# Patient Record
Sex: Female | Born: 1990 | Race: Black or African American | Hispanic: No | Marital: Single | Smoking: Current every day smoker
Health system: Southern US, Community
[De-identification: ages and names within clinical notes are randomized; demographics above are authoritative.]

## PROBLEM LIST (undated history)

## (undated) ENCOUNTER — Inpatient Hospital Stay: Payer: Self-pay

## (undated) DIAGNOSIS — O3410 Maternal care for benign tumor of corpus uteri, unspecified trimester: Secondary | ICD-10-CM

## (undated) DIAGNOSIS — D259 Leiomyoma of uterus, unspecified: Secondary | ICD-10-CM

## (undated) DIAGNOSIS — E0590 Thyrotoxicosis, unspecified without thyrotoxic crisis or storm: Secondary | ICD-10-CM

## (undated) HISTORY — PX: BREAST CYST EXCISION: SHX579

---
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
## 2008-10-08 ENCOUNTER — Emergency Department (HOSPITAL_COMMUNITY): Admission: EM | Admit: 2008-10-08 | Discharge: 2008-10-08 | Payer: Self-pay | Admitting: Emergency Medicine

## 2012-09-18 ENCOUNTER — Emergency Department: Payer: Self-pay | Admitting: Emergency Medicine

## 2012-09-18 LAB — URINALYSIS, COMPLETE
Bilirubin,UR: NEGATIVE
Glucose,UR: NEGATIVE mg/dL (ref 0–75)
Ketone: NEGATIVE
Leukocyte Esterase: NEGATIVE
Ph: 6 (ref 4.5–8.0)
RBC,UR: NONE SEEN /HPF (ref 0–5)
Squamous Epithelial: 1

## 2013-10-07 ENCOUNTER — Emergency Department: Payer: Self-pay | Admitting: Internal Medicine

## 2013-12-19 ENCOUNTER — Emergency Department: Payer: Self-pay | Admitting: Internal Medicine

## 2013-12-19 LAB — RAPID INFLUENZA A&B ANTIGENS (ARMC ONLY)

## 2015-03-23 ENCOUNTER — Emergency Department
Admit: 2015-03-23 | Disposition: A | Discharge status: Home / Self Care | Payer: Self-pay | Admitting: Emergency Medicine

## 2015-03-23 LAB — HCG, QUANTITATIVE, PREGNANCY: Beta Hcg, Quant.: 32491 m[IU]/mL — ABNORMAL HIGH

## 2015-03-23 LAB — CBC
HCT: 42.5 % (ref 35.0–47.0)
HGB: 13.7 g/dL (ref 12.0–16.0)
MCH: 28.2 pg (ref 26.0–34.0)
MCHC: 32.3 g/dL (ref 32.0–36.0)
MCV: 87 fL (ref 80–100)
PLATELETS: 222 10*3/uL (ref 150–440)
RBC: 4.87 10*6/uL (ref 3.80–5.20)
RDW: 14.9 % — AB (ref 11.5–14.5)
WBC: 7.2 10*3/uL (ref 3.6–11.0)

## 2015-03-31 ENCOUNTER — Emergency Department
Admit: 2015-03-31 | Disposition: A | Discharge status: Home / Self Care | Payer: Self-pay | Admitting: Emergency Medicine

## 2015-03-31 LAB — URINALYSIS, COMPLETE
Bacteria: NONE SEEN
Bilirubin,UR: NEGATIVE
Glucose,UR: NEGATIVE mg/dL (ref 0–75)
Ketone: NEGATIVE
Leukocyte Esterase: NEGATIVE
NITRITE: NEGATIVE
PROTEIN: NEGATIVE
Ph: 7 (ref 4.5–8.0)
Specific Gravity: 1.019 (ref 1.003–1.030)

## 2015-03-31 LAB — CBC
HCT: 38.2 % (ref 35.0–47.0)
HGB: 12.7 g/dL (ref 12.0–16.0)
MCH: 28.7 pg (ref 26.0–34.0)
MCHC: 33.3 g/dL (ref 32.0–36.0)
MCV: 86 fL (ref 80–100)
Platelet: 203 10*3/uL (ref 150–440)
RBC: 4.44 10*6/uL (ref 3.80–5.20)
RDW: 14.5 % (ref 11.5–14.5)
WBC: 8.7 10*3/uL (ref 3.6–11.0)

## 2015-03-31 LAB — HCG, QUANTITATIVE, PREGNANCY: BETA HCG, QUANT.: 65769 m[IU]/mL — AB

## 2015-04-28 ENCOUNTER — Encounter: Payer: Self-pay | Admitting: *Deleted

## 2015-04-28 ENCOUNTER — Emergency Department
Admission: EM | Admit: 2015-04-28 | Discharge: 2015-04-28 | Disposition: A | Discharge status: Home / Self Care | Payer: Medicaid Other | Attending: Emergency Medicine | Admitting: Emergency Medicine

## 2015-04-28 ENCOUNTER — Emergency Department: Payer: Medicaid Other

## 2015-04-28 DIAGNOSIS — O039 Complete or unspecified spontaneous abortion without complication: Secondary | ICD-10-CM | POA: Diagnosis not present

## 2015-04-28 DIAGNOSIS — N939 Abnormal uterine and vaginal bleeding, unspecified: Secondary | ICD-10-CM

## 2015-04-28 DIAGNOSIS — O209 Hemorrhage in early pregnancy, unspecified: Secondary | ICD-10-CM | POA: Diagnosis present

## 2015-04-28 DIAGNOSIS — Z3A11 11 weeks gestation of pregnancy: Secondary | ICD-10-CM | POA: Diagnosis not present

## 2015-04-28 DIAGNOSIS — Z87891 Personal history of nicotine dependence: Secondary | ICD-10-CM | POA: Diagnosis not present

## 2015-04-28 LAB — CBC
HCT: 40.6 % (ref 35.0–47.0)
Hemoglobin: 13.5 g/dL (ref 12.0–16.0)
MCH: 28.6 pg (ref 26.0–34.0)
MCHC: 33.2 g/dL (ref 32.0–36.0)
MCV: 86.3 fL (ref 80.0–100.0)
PLATELETS: 197 10*3/uL (ref 150–440)
RBC: 4.71 MIL/uL (ref 3.80–5.20)
RDW: 14.3 % (ref 11.5–14.5)
WBC: 6.1 10*3/uL (ref 3.6–11.0)

## 2015-04-28 LAB — WET PREP, GENITAL
Trich, Wet Prep: NONE SEEN
Yeast Wet Prep HPF POC: NONE SEEN

## 2015-04-28 LAB — ABO/RH
ABO/RH(D): O POS
ABO/RH(D): O POS

## 2015-04-28 LAB — CHLAMYDIA/NGC RT PCR (ARMC ONLY)
Chlamydia Tr: NOT DETECTED
N gonorrhoeae: NOT DETECTED

## 2015-04-28 LAB — HCG, QUANTITATIVE, PREGNANCY: HCG, BETA CHAIN, QUANT, S: 7792 m[IU]/mL — AB (ref <5)

## 2015-04-28 NOTE — ED Notes (Signed)
Pt is G1P0A0, LMP 01/27/15. Pt started having pelvic pain yesterday morning. Pt states when she came home from work, worsening pain and bleeding about 2200. Pt states woke from sleep at 0200, pain and bleeding worse at that time. Pt is not wearing a pad and cannot report saturation. Pt has had no prenatal care as yet.

## 2015-04-28 NOTE — Discharge Instructions (Signed)
Miscarriage A miscarriage is the sudden loss of an unborn baby (fetus) before the 20th week of pregnancy. Most miscarriages happen in the first 3 months of pregnancy. Sometimes, it happens before a woman even knows she is pregnant. A miscarriage is also called a "spontaneous miscarriage" or "early pregnancy loss." Having a miscarriage can be an emotional experience. Talk with your caregiver about any questions you may have about miscarrying, the grieving process, and your future pregnancy plans. CAUSES   Problems with the fetal chromosomes that make it impossible for the baby to develop normally. Problems with the baby's genes or chromosomes are most often the result of errors that occur, by chance, as the embryo divides and grows. The problems are not inherited from the parents.  Infection of the cervix or uterus.   Hormone problems.   Problems with the cervix, such as having an incompetent cervix. This is when the tissue in the cervix is not strong enough to hold the pregnancy.   Problems with the uterus, such as an abnormally shaped uterus, uterine fibroids, or congenital abnormalities.   Certain medical conditions.   Smoking, drinking alcohol, or taking illegal drugs.   Trauma.  Often, the cause of a miscarriage is unknown.  SYMPTOMS   Vaginal bleeding or spotting, with or without cramps or pain.  Pain or cramping in the abdomen or lower back.  Passing fluid, tissue, or blood clots from the vagina. DIAGNOSIS  Your caregiver will perform a physical exam. You may also have an ultrasound to confirm the miscarriage. Blood or urine tests may also be ordered. TREATMENT   Sometimes, treatment is not necessary if you naturally pass all the fetal tissue that was in the uterus. If some of the fetus or placenta remains in the body (incomplete miscarriage), tissue left behind may become infected and must be removed. Usually, a dilation and curettage (D and C) procedure is performed.  During a D and C procedure, the cervix is widened (dilated) and any remaining fetal or placental tissue is gently removed from the uterus.  Antibiotic medicines are prescribed if there is an infection. Other medicines may be given to reduce the size of the uterus (contract) if there is a lot of bleeding.  If you have Rh negative blood and your baby was Rh positive, you will need a Rh immunoglobulin shot. This shot will protect any future baby from having Rh blood problems in future pregnancies. HOME CARE INSTRUCTIONS   Your caregiver may order bed rest or may allow you to continue light activity. Resume activity as directed by your caregiver.  Have someone help with home and family responsibilities during this time.   Keep track of the number of sanitary pads you use each day and how soaked (saturated) they are. Write down this information.   Do not use tampons. Do not douche or have sexual intercourse until approved by your caregiver.   Only take over-the-counter or prescription medicines for pain or discomfort as directed by your caregiver.   Do not take aspirin. Aspirin can cause bleeding.   Keep all follow-up appointments with your caregiver.   If you or your partner have problems with grieving, talk to your caregiver or seek counseling to help cope with the pregnancy loss. Allow enough time to grieve before trying to get pregnant again.  SEEK IMMEDIATE MEDICAL CARE IF:   You have severe cramps or pain in your back or abdomen.  You have a fever.  You pass large blood clots (walnut-sized  or larger) ortissue from your vagina. Save any tissue for your caregiver to inspect.   Your bleeding increases.   You have a thick, bad-smelling vaginal discharge.  You become lightheaded, weak, or you faint.   You have chills.  MAKE SURE YOU:  Understand these instructions.  Will watch your condition.  Will get help right away if you are not doing well or get  worse. Document Released: 05/25/2001 Document Revised: 03/26/2013 Document Reviewed: 01/18/2012 [facility] Patient Information 2015 [place], [place]. This information is not intended to replace advice given to you by your health care provider. Make sure you discuss any questions you have with your health care provider.  Incomplete Miscarriage A miscarriage is the sudden loss of an unborn baby (fetus) before the 20th week of pregnancy. In an incomplete miscarriage, parts of the fetus or placenta (afterbirth) remain in the body.  Having a miscarriage can be an emotional experience. Talk with your health care provider about any questions you may have about miscarrying, the grieving process, and your future pregnancy plans. CAUSES   Problems with the fetal chromosomes that make it impossible for the baby to develop normally. Problems with the baby's genes or chromosomes are most often the result of errors that occur by chance as the embryo divides and grows. The problems are not inherited from the parents.  Infection of the cervix or uterus.  Hormone problems.  Problems with the cervix, such as having an incompetent cervix. This is when the tissue in the cervix is not strong enough to hold the pregnancy.  Problems with the uterus, such as an abnormally shaped uterus, uterine fibroids, or congenital abnormalities.  Certain medical conditions.  Smoking, drinking alcohol, or taking illegal drugs.  Trauma. SYMPTOMS   Vaginal bleeding or spotting, with or without cramps or pain.  Pain or cramping in the abdomen or lower back.  Passing fluid, tissue, or blood clots from the vagina. DIAGNOSIS  Your health care provider will perform a physical exam. You may also have an ultrasound to confirm the miscarriage. Blood or urine tests may also be ordered. TREATMENT   Usually, a dilation and curettage (D&C) procedure is performed. During a D&C procedure, the cervix is widened (dilated) and any remaining  fetal or placental tissue is gently removed from the uterus.  Antibiotic medicines are prescribed if there is an infection. Other medicines may be given to reduce the size of the uterus (contract) if there is a lot of bleeding.  If you have Rh negative blood and your baby was Rh positive, you will need a Rho (D) immune globulin shot. This shot will protect any future baby from having Rh blood problems in future pregnancies.  You may be confined to bed rest. This means you should stay in bed and only get up to use the bathroom. HOME CARE INSTRUCTIONS   Rest as directed by your health care provider.  Restrict activity as directed by your health care provider. You may be allowed to continue light activity if curettage was not done but you require further treatment.  Keep track of the number of pads you use each day. Keep track of how soaked (saturated) they are. Record this information.  Do not  use tampons.  Do not douche or have sexual intercourse until approved by your health care provider.  Keep all follow-up appointments for reevaluation and continuing management.  Only take over-the-counter or prescription medicines for pain, fever, or discomfort as directed by your health care provider.  Take  antibiotic medicine as directed by your health care provider. Make sure you finish it even if you start to feel better. SEEK IMMEDIATE MEDICAL CARE IF:   You experience severe cramps in your stomach, back, or abdomen.  You have an unexplained temperature (make sure to record these temperatures).  You pass large clots or tissue (save these for your health care provider to inspect).  Your bleeding increases.  You become light-headed, weak, or have fainting episodes. MAKE SURE YOU:   Understand these instructions.  Will watch your condition.  Will get help right away if you are not doing well or get worse. Document Released: 11/29/2005 Document Revised: 04/15/2014 Document Reviewed:  06/28/2013 [facility] Patient Information 2015 [place], [place]. This information is not intended to replace advice given to you by your health care provider. Make sure you discuss any questions you have with your health care provider.

## 2015-04-28 NOTE — ED Provider Notes (Signed)
[facility] Emergency Department Provider Note  ____________________________________________  Time seen: Approximately 073 AM  I have reviewed the triage vital signs and the nursing notes.   HISTORY  Chief Complaint Vaginal Bleeding    HPI Samantha Rose is a 24 y.o. female who reports that she is [redacted] weeks pregnant. The patient comes in with vaginal bleeding that she reports started yesterday during the day. The patient reports that the bleeding has been heavy like a period. The patient reports though that she is not soaking through pads. She has had some lower abdominal pain and cramping. Currently she reports that her pain is a 0 out of 10 intensity. The patient is a G1 P0. The patient reports that she has had this bleeding in the past and has been evaluated in the emergency department. The patient has not yet seen an OB/GYN but does have an appointment to see one this week. The patient denies any vaginal discharge, denies any burning with urination, denies any fevers, chest pain, respiratory distress.   History reviewed. No pertinent past medical history.  There are no active problems to display for this patient.   Past Surgical History  Procedure Laterality Date  . Breast cyst excision Right     No current outpatient prescriptions on file.  Allergies Review of patient's allergies indicates no known allergies.  History reviewed. No pertinent family history.  Social History History  Substance Use Topics  . Smoking status: Former Smoker    Types: Cigarettes  . Smokeless tobacco: Never Used  . Alcohol Use: No    Review of Systems Constitutional: No fever/chills Eyes: No visual changes. ENT: No sore throat. Cardiovascular: Denies chest pain. Respiratory: Denies shortness of breath. Gastrointestinal: abdominal pain.   Genitourinary: Negative for dysuria. Musculoskeletal: Negative for back pain. Skin: Negative for rash. Neurological:  Negative for headaches, focal weakness or numbness.  10-point ROS otherwise negative.  ____________________________________________   PHYSICAL EXAM:  VITAL SIGNS: ED Triage Vitals  Enc Vitals Group     BP 04/28/15 0313 130/72 mmHg     Pulse Rate 04/28/15 0313 88     Resp 04/28/15 0313 16     Temp 04/28/15 0313 97.7 F (36.5 C)     Temp Source 04/28/15 0313 Oral     SpO2 04/28/15 0313 100 %     Weight 04/28/15 0313 151 lb 11.2 oz (68.811 kg)     Height 04/28/15 0313 5\' 3"  (1.6 m)     Head Cir --      Peak Flow --      Pain Score 04/28/15 0316 10     Pain Loc --      Pain Edu? --      Excl. in [place]? --     Constitutional: Alert and oriented. Well appearing and in no acute distress. Eyes: Conjunctivae are normal. PERRL. EOMI. Head: Atraumatic. Nose: No congestion/rhinnorhea. Mouth/Throat: Mucous membranes are moist.  Oropharynx non-erythematous. Cardiovascular: Normal rate, regular rhythm. Grossly normal heart sounds.  Good peripheral circulation. Respiratory: Normal respiratory effort.  No retractions. Lungs CTAB. Gastrointestinal: Soft and nontender. No distention.  Genitourinary: Normal external genitalia. Mild blood in vaginal vault with no clots. Cervix closed visually and on bimanual exam. Enlarged uterus with no tenderness to palpation. Musculoskeletal: No lower extremity tenderness nor edema.  No joint effusions. Neurologic:  Normal speech and language. No gross focal neurologic deficits are appreciated. Speech is normal.  Skin:  Skin is warm, dry and intact. No rash noted.  Psychiatric: Mood and affect are normal. Speech and behavior are normal.  ____________________________________________   LABS (all labs ordered are listed, but only abnormal results are displayed)  Labs Reviewed  HCG, QUANTITATIVE, PREGNANCY - Abnormal; Notable for the following:    hCG, Beta Chain, Quant, S 7792 (*)    All other components within normal limits  WET PREP, GENITAL   CHLAMYDIA/NGC RT PCR ([facility])   CBC  ABO/RH  ABO/RH   ____________________________________________  EKG  None ____________________________________________  RADIOLOGY  Transvaginal ultrasound: Irregular intrauterine gestational sac, no fetal pole or U exact. Findings again meet definitive criteria for failed pregnancy particularly in conjunction with decreasing beta hCG. ____________________________________________   PROCEDURES  Procedure(s) performed: None  Critical Care performed: No  ____________________________________________   INITIAL IMPRESSION / ASSESSMENT AND PLAN / ED COURSE  Pertinent labs & imaging results that were available during my care of the patient were reviewed by me and considered in my medical decision making (see chart for details).  The patient is a 24 year old female who comes in with vaginal bleeding and she is [redacted] weeks pregnant. According to the ultrasound the patient does not appear to have a fetal [place] sac in her gestational sac with a concern that this is a failed pregnancy. The patient had an ultrasound done last month with similar results. I will discharge the patient home to follow-up with OB/GYN to determine if she needs a D&C or any other intervention for the failed pregnancy. ____________________________________________   FINAL CLINICAL IMPRESSION(S) / ED DIAGNOSES  Final diagnoses:  Vaginal bleeding  Failed pregnancy  Miscarriage       Loney Hering, MD 04/28/15 [phone]

## 2016-03-12 ENCOUNTER — Emergency Department
Admission: EM | Admit: 2016-03-12 | Discharge: 2016-03-12 | Disposition: A | Discharge status: Home / Self Care | Payer: Managed Care, Other (non HMO) | Attending: Emergency Medicine | Admitting: Emergency Medicine

## 2016-03-12 ENCOUNTER — Encounter: Payer: Self-pay | Admitting: Emergency Medicine

## 2016-03-12 DIAGNOSIS — R51 Headache: Secondary | ICD-10-CM | POA: Diagnosis present

## 2016-03-12 DIAGNOSIS — F17210 Nicotine dependence, cigarettes, uncomplicated: Secondary | ICD-10-CM | POA: Insufficient documentation

## 2016-03-12 DIAGNOSIS — R519 Headache, unspecified: Secondary | ICD-10-CM

## 2016-03-12 MED ORDER — IBUPROFEN 800 MG PO TABS: 800.0000 mg | ORAL_TABLET | Freq: Three times a day (TID) | ORAL | Status: DC | PRN

## 2016-03-12 MED ORDER — IBUPROFEN 800 MG PO TABS
800.0000 mg | ORAL_TABLET | Freq: Once | ORAL | Status: AC
  Administered 2016-03-12: 800 mg via ORAL
  Filled 2016-03-12: qty 1

## 2016-03-12 MED ORDER — DIPHENHYDRAMINE HCL 25 MG PO CAPS
25.0000 mg | ORAL_CAPSULE | Freq: Once | ORAL | Status: AC
  Administered 2016-03-12: 25 mg via ORAL
  Filled 2016-03-12: qty 1

## 2016-03-12 MED ORDER — CYCLOBENZAPRINE HCL 5 MG PO TABS: 5.0000 mg | ORAL_TABLET | Freq: Three times a day (TID) | ORAL | Status: DC | PRN

## 2016-03-12 NOTE — ED Notes (Signed)
Patient reports HA since Sunday near left temporal/above left eye.  Denies any other sxs- no N/V, photophobia.  Took 2 pills of aleve last night and 2 tylenol this morning with no relief.

## 2016-03-12 NOTE — ED Provider Notes (Signed)
[facility] Emergency Department Provider Note ____________________________________________  Time seen: 41  I have reviewed the triage vital signs and the nursing notes.  HISTORY  Chief Complaint  Headache  HPI Samantha Rose is a 25 y.o. female visits to the ED for evaluation of a headache that has been intermittent since Sunday. Patient describes pain at 6/10 pain localized to the left side of the forehead. She has taken Tylenol today at 7 AM. Yesterday she dosed 2 Aleve tabs at one time. She denies any nausea, vomiting, dizziness, or vision change. She also denies any URI symptoms. She presents to the ED because typically her headaches will respond well to over-the-counter pain medicine.  History reviewed. No pertinent past medical history.  There are no active problems to display for this patient.   Past Surgical History  Procedure Laterality Date  . Breast cyst excision Right     Current Outpatient Rx  Name  Route  Sig  Dispense  Refill  . cyclobenzaprine (FLEXERIL) 5 MG tablet   Oral   Take 1 tablet (5 mg total) by mouth every 8 (eight) hours as needed for muscle spasms.   30 tablet   1   . ibuprofen (ADVIL,MOTRIN) 800 MG tablet   Oral   Take 1 tablet (800 mg total) by mouth every 8 (eight) hours as needed.   30 tablet   0     Allergies Review of patient's allergies indicates no known allergies.  History reviewed. No pertinent family history.  Social History Social History  Substance Use Topics  . Smoking status: Current Every Day Smoker    Types: Cigarettes  . Smokeless tobacco: Never Used  . Alcohol Use: No   Review of Systems  Constitutional: Negative for fever. Eyes: Negative for visual changes. ENT: Negative for sore throat. Cardiovascular: Negative for chest pain. Respiratory: Negative for shortness of breath. Gastrointestinal: Negative for abdominal pain, vomiting and diarrhea. Genitourinary: Negative for  dysuria. Musculoskeletal: Negative for back pain. Skin: Negative for rash. Neurological: Negative for focal weakness or numbness. Reports headache as above ____________________________________________  PHYSICAL EXAM:  VITAL SIGNS: ED Triage Vitals  Enc Vitals Group     BP 03/12/16 1307 116/71 mmHg     Pulse Rate 03/12/16 1307 92     Resp --      Temp 03/12/16 1307 98.4 F (36.9 C)     Temp Source 03/12/16 1307 Oral     SpO2 03/12/16 1307 97 %     Weight 03/12/16 1307 160 lb (72.576 kg)     Height 03/12/16 1307 5\' 3"  (1.6 m)     Head Cir --      Peak Flow --      Pain Score 03/12/16 1307 6     Pain Loc --      Pain Edu? --      Excl. in [place]? --     Constitutional: Alert and oriented. Well appearing and in no distress. She is found to be lively and walking in the room and interacting with her family members. Head: Normocephalic and atraumatic.      Eyes: Conjunctivae are normal. PERRL. Normal extraocular movements      Ears: Canals clear. TMs intact bilaterally.   Nose: No congestion/rhinorrhea.   Mouth/Throat: Mucous membranes are moist.   Neck: Supple. No thyromegaly. Hematological/Lymphatic/Immunological: No cervical lymphadenopathy. Cardiovascular: Normal rate, regular rhythm.  Respiratory: Normal respiratory effort. No wheezes/rales/rhonchi. Gastrointestinal: Soft and nontender. No distention. Musculoskeletal: Nontender with normal range  of motion in all extremities.  Neurologic: Cranial nerves II through XII grossly intact. Normal UE and LE DTRs bilaterally. Normal gait without ataxia. Normal speech and language. No gross focal neurologic deficits are appreciated. Skin:  Skin is warm, dry and intact. No rash noted. Psychiatric: Mood and affect are normal. Patient exhibits appropriate insight and judgment. ____________________________________________  PROCEDURES  IBU 800 mg PO Benadryl 25 mg PO ____________________________________________  INITIAL  IMPRESSION / ASSESSMENT AND PLAN / ED COURSE  Patient with near resolution of her left frontal headache following ED administration of PO medications. She otherwise is found to have a stable exam without neuromuscular deficit. She is discharged with prescriptions for ibuprofen 800 and cyclobenzaprine to dose as needed for headache pain. She will dose over-the-counter Benadryl as needed for headache pain resolution. She will follow-up with her provider for ongoing symptom management.  ____________________________________________  FINAL CLINICAL IMPRESSION(S) / ED DIAGNOSES  Final diagnoses:  Headache above the eye region      [employer], PA-C 03/12/16 1750  Lisa Roca, MD 03/12/16 1845

## 2016-03-12 NOTE — Discharge Instructions (Signed)
General Headache Without Cause A headache is pain or discomfort felt around the head or neck area. There are many causes and types of headaches. In some cases, the cause may not be found.  HOME CARE  Managing Pain  Take over-the-counter and prescription medicines only as told by your doctor.  Lie down in a dark, quiet room when you have a headache.  If directed, apply ice to the head and neck area:  Put ice in a plastic bag.  Place a towel between your skin and the bag.  Leave the ice on for 20 minutes, 2-3 times per day.  Use a heating pad or hot shower to apply heat to the head and neck area as told by your doctor.  Keep lights dim if bright lights bother you or make your headaches worse. Eating and Drinking  Eat meals on a regular schedule.  Lessen how much alcohol you drink.  Lessen how much caffeine you drink, or stop drinking caffeine. General Instructions  Keep all follow-up visits as told by your doctor. This is important.  Keep a journal to find out if certain things bring on headaches. For example, write down:  What you eat and drink.  How much sleep you get.  Any change to your diet or medicines.  Relax by getting a massage or doing other relaxing activities.  Lessen stress.  Sit up straight. Do not tighten (tense) your muscles.  Do not use tobacco products. This includes cigarettes, chewing tobacco, or e-cigarettes. If you need help quitting, ask your doctor.  Exercise regularly as told by your doctor.  Get enough sleep. This often means 7-9 hours of sleep. GET HELP IF:  Your symptoms are not helped by medicine.  You have a headache that feels different than the other headaches.  You feel sick to your stomach (nauseous) or you throw up (vomit).  You have a fever. GET HELP RIGHT AWAY IF:   Your headache becomes really bad.  You keep throwing up.  You have a stiff neck.  You have trouble seeing.  You have trouble speaking.  You have  pain in the eye or ear.  Your muscles are weak or you lose muscle control.  You lose your balance or have trouble walking.  You feel like you will pass out (faint) or you pass out.  You have confusion.   This information is not intended to replace advice given to you by your health care provider. Make sure you discuss any questions you have with your health care provider.   Document Released: 09/07/2008 Document Revised: 08/20/2015 Document Reviewed: 03/24/2015 Elsevier Interactive Patient Education [employer].  Take only the prescription meds as directed for headache pain relief. You may dose OTC Tylenol for additional non-drowsy pain relief, up to 3 times a day. Follow-up with [facility] for routine medical care.

## 2016-03-12 NOTE — ED Notes (Signed)
Pt with Headache since Sunday, now 6/10 over left eye. Denies weakness or vision changes. Has taken tylenol and aleve with no change.

## 2016-04-14 ENCOUNTER — Other Ambulatory Visit: Payer: Self-pay | Admitting: Internal Medicine

## 2016-04-14 DIAGNOSIS — E042 Nontoxic multinodular goiter: Secondary | ICD-10-CM

## 2016-04-14 DIAGNOSIS — R7989 Other specified abnormal findings of blood chemistry: Secondary | ICD-10-CM

## 2016-04-22 ENCOUNTER — Encounter
Admission: RE | Admit: 2016-04-22 | Discharge: 2016-04-22 | Disposition: A | Discharge status: Home / Self Care | Payer: Managed Care, Other (non HMO) | Source: Ambulatory Visit | Attending: Internal Medicine | Admitting: Internal Medicine

## 2016-04-22 DIAGNOSIS — E042 Nontoxic multinodular goiter: Secondary | ICD-10-CM | POA: Diagnosis present

## 2016-04-22 DIAGNOSIS — R7989 Other specified abnormal findings of blood chemistry: Secondary | ICD-10-CM

## 2016-04-22 DIAGNOSIS — R946 Abnormal results of thyroid function studies: Secondary | ICD-10-CM | POA: Insufficient documentation

## 2016-04-22 MED ORDER — SODIUM IODIDE I-123 3.7 MBQ PO CAPS
200.0000 | ORAL_CAPSULE | Freq: Once | ORAL | Status: AC
  Administered 2016-04-22: 164.8 via ORAL

## 2016-04-23 ENCOUNTER — Encounter
Admission: RE | Admit: 2016-04-23 | Discharge: 2016-04-23 | Disposition: A | Discharge status: Home / Self Care | Payer: Managed Care, Other (non HMO) | Source: Ambulatory Visit | Attending: Internal Medicine | Admitting: Internal Medicine

## 2016-05-31 ENCOUNTER — Emergency Department: Payer: Managed Care, Other (non HMO)

## 2016-05-31 ENCOUNTER — Emergency Department
Admission: EM | Admit: 2016-05-31 | Discharge: 2016-05-31 | Disposition: A | Discharge status: Home / Self Care | Payer: Managed Care, Other (non HMO) | Attending: Emergency Medicine | Admitting: Emergency Medicine

## 2016-05-31 ENCOUNTER — Encounter: Payer: Self-pay | Admitting: Emergency Medicine

## 2016-05-31 DIAGNOSIS — F17210 Nicotine dependence, cigarettes, uncomplicated: Secondary | ICD-10-CM | POA: Diagnosis not present

## 2016-05-31 DIAGNOSIS — Z79899 Other long term (current) drug therapy: Secondary | ICD-10-CM | POA: Insufficient documentation

## 2016-05-31 DIAGNOSIS — Z9889 Other specified postprocedural states: Secondary | ICD-10-CM | POA: Diagnosis not present

## 2016-05-31 DIAGNOSIS — N760 Acute vaginitis: Secondary | ICD-10-CM | POA: Insufficient documentation

## 2016-05-31 DIAGNOSIS — R935 Abnormal findings on diagnostic imaging of other abdominal regions, including retroperitoneum: Secondary | ICD-10-CM | POA: Insufficient documentation

## 2016-05-31 DIAGNOSIS — N83209 Unspecified ovarian cyst, unspecified side: Secondary | ICD-10-CM

## 2016-05-31 DIAGNOSIS — R102 Pelvic and perineal pain: Secondary | ICD-10-CM | POA: Diagnosis not present

## 2016-05-31 DIAGNOSIS — R188 Other ascites: Secondary | ICD-10-CM

## 2016-05-31 DIAGNOSIS — R52 Pain, unspecified: Secondary | ICD-10-CM

## 2016-05-31 DIAGNOSIS — R109 Unspecified abdominal pain: Secondary | ICD-10-CM | POA: Diagnosis present

## 2016-05-31 DIAGNOSIS — B9689 Other specified bacterial agents as the cause of diseases classified elsewhere: Secondary | ICD-10-CM

## 2016-05-31 LAB — CBC
HEMATOCRIT: 35.7 % (ref 35.0–47.0)
HEMATOCRIT: 39.2 % (ref 35.0–47.0)
HEMOGLOBIN: 12 g/dL (ref 12.0–16.0)
Hemoglobin: 13.2 g/dL (ref 12.0–16.0)
MCH: 29 pg (ref 26.0–34.0)
MCH: 29.2 pg (ref 26.0–34.0)
MCHC: 33.8 g/dL (ref 32.0–36.0)
MCHC: 33.8 g/dL (ref 32.0–36.0)
MCV: 86 fL (ref 80.0–100.0)
MCV: 86.5 fL (ref 80.0–100.0)
Platelets: 184 10*3/uL (ref 150–440)
Platelets: 213 10*3/uL (ref 150–440)
RBC: 4.12 MIL/uL (ref 3.80–5.20)
RBC: 4.56 MIL/uL (ref 3.80–5.20)
RDW: 13.9 % (ref 11.5–14.5)
RDW: 14.3 % (ref 11.5–14.5)
WBC: 6.2 10*3/uL (ref 3.6–11.0)
WBC: 8.9 10*3/uL (ref 3.6–11.0)

## 2016-05-31 LAB — WET PREP, GENITAL
Sperm: NONE SEEN
TRICH WET PREP: NONE SEEN
YEAST WET PREP: NONE SEEN

## 2016-05-31 LAB — COMPREHENSIVE METABOLIC PANEL
ALT: 11 U/L — ABNORMAL LOW (ref 14–54)
AST: 16 U/L (ref 15–41)
Albumin: 4.6 g/dL (ref 3.5–5.0)
Alkaline Phosphatase: 40 U/L (ref 38–126)
Anion gap: 7 (ref 5–15)
BILIRUBIN TOTAL: 0.4 mg/dL (ref 0.3–1.2)
BUN: 11 mg/dL (ref 6–20)
CO2: 24 mmol/L (ref 22–32)
Calcium: 9.3 mg/dL (ref 8.9–10.3)
Chloride: 106 mmol/L (ref 101–111)
Creatinine, Ser: 0.74 mg/dL (ref 0.44–1.00)
Glucose, Bld: 111 mg/dL — ABNORMAL HIGH (ref 65–99)
POTASSIUM: 3.5 mmol/L (ref 3.5–5.1)
Sodium: 137 mmol/L (ref 135–145)
TOTAL PROTEIN: 7.4 g/dL (ref 6.5–8.1)

## 2016-05-31 LAB — URINALYSIS COMPLETE WITH MICROSCOPIC (ARMC ONLY)
BACTERIA UA: NONE SEEN
Bilirubin Urine: NEGATIVE
GLUCOSE, UA: NEGATIVE mg/dL
Hgb urine dipstick: NEGATIVE
Ketones, ur: NEGATIVE mg/dL
LEUKOCYTES UA: NEGATIVE
NITRITE: NEGATIVE
Protein, ur: NEGATIVE mg/dL
SPECIFIC GRAVITY, URINE: 1.021 (ref 1.005–1.030)
pH: 6 (ref 5.0–8.0)

## 2016-05-31 LAB — CHLAMYDIA/NGC RT PCR (ARMC ONLY)
CHLAMYDIA TR: NOT DETECTED
N gonorrhoeae: NOT DETECTED

## 2016-05-31 LAB — HCG, QUANTITATIVE, PREGNANCY

## 2016-05-31 LAB — POCT PREGNANCY, URINE: Preg Test, Ur: NEGATIVE

## 2016-05-31 MED ORDER — METRONIDAZOLE 500 MG PO TABS: 500.0000 mg | ORAL_TABLET | Freq: Three times a day (TID) | ORAL | Status: AC

## 2016-05-31 MED ORDER — OXYCODONE-ACETAMINOPHEN 5-325 MG PO TABS
ORAL_TABLET | ORAL | Status: AC
  Administered 2016-05-31: 1
  Filled 2016-05-31: qty 1

## 2016-05-31 MED ORDER — METRONIDAZOLE 500 MG PO TABS: 500.0000 mg | ORAL_TABLET | Freq: Two times a day (BID) | ORAL | Status: DC

## 2016-05-31 MED ORDER — OXYCODONE-ACETAMINOPHEN 5-325 MG PO TABS: 1.0000 | ORAL_TABLET | Freq: Four times a day (QID) | ORAL | Status: DC | PRN

## 2016-05-31 MED ORDER — IOPAMIDOL (ISOVUE-300) INJECTION 61%
85.0000 mL | Freq: Once | INTRAVENOUS | Status: AC | PRN
  Administered 2016-05-31: 85 mL via INTRAVENOUS

## 2016-05-31 MED ORDER — DIATRIZOATE MEGLUMINE & SODIUM 66-10 % PO SOLN
15.0000 mL | Freq: Once | ORAL | Status: AC
  Administered 2016-05-31: 15 mL via ORAL

## 2016-05-31 MED ORDER — METRONIDAZOLE 500 MG PO TABS: 500.0000 mg | ORAL_TABLET | Freq: Once | ORAL | Status: DC

## 2016-05-31 NOTE — ED Notes (Signed)
Dr. Dione Booze at bedside

## 2016-05-31 NOTE — Consult Note (Signed)
Consult Gynecology   SERVICE: Gynecology   Patient Name: Samantha Rose Patient MRN:   [id number]  CC: Acute right lower quadrant pain  HPI: Samantha MYRANDA Rose is a 25 y.o. G1P0010 with acute onset of RLQ pain yesterday. It is constant, moderate to severe, R>L and improved with oxycodone. Some diarrhea x1, no n/v/c/fever, anorexia. Difficulty with ambulation because of pain. Not dizzy, not tachycardic. No vaginal bleeding.  Imaging revealed complex fluid in pelvis and tracking up right paracolic gutter, with an adnexal mass. Beta quant was negative. Hgb stable, with 13->12 over twelve hours.  Sexually active with female partner, no contraception.  Hx of hallucinations with Percocet.  OBHx: [id number] with prior SAB No prior surgeries   Review of Systems: positives in bold GEN:   fevers, chills, weight changes, appetite changes, fatigue, night sweats HEENT:  HA, vision changes, hearing loss, congestion, rhinorrhea, sinus pressure, dysphagia CV:   CP, palpitations PULM:  SOB, cough GI:  abd pain, N/V/D/C GU:  dysuria, urgency, frequency MSK:  arthralgias, myalgias, back pain, swelling SKIN:  rashes, color changes, pallor NEURO:  numbness, weakness, tingling, seizures, dizziness, tremors PSYCH:  depression, anxiety, behavioral problems, confusion  HEME/LYMPH:  easy bruising or bleeding ENDO:  heat/cold intolerance  Past Obstetrical History: OB History    Gravida Para Term Preterm AB TAB SAB Ectopic Multiple Living   1 0     0         Past Gynecologic History: Patient's last menstrual period was 05/10/2016 (approximate). Menstrual frequency Q 4 wks lasting 4 days. No dysmenorrhea or menorrhagia.   Past Medical History: History reviewed. No pertinent past medical history.  Past Surgical History:   Past Surgical History  Procedure Laterality Date  . Breast cyst excision Right     Family History:  family history is not on file.  Social History:  Social History   Social  History  . Marital Status: Single    Spouse Name: N/A  . Number of Children: N/A  . Years of Education: N/A   Occupational History  . Not on file.   Social History Main Topics  . Smoking status: Current Every Day Smoker    Types: Cigarettes  . Smokeless tobacco: Never Used  . Alcohol Use: No  . Drug Use: No  . Sexual Activity: Yes   Other Topics Concern  . Not on file   Social History Narrative    Home Medications:  Medications reconciled in EPIC  No current facility-administered medications on file prior to encounter.   No current outpatient prescriptions on file prior to encounter.    Allergies:  No Known Allergies  Physical Exam:  Temp:  [98 F (36.7 C)] 98 F (36.7 C) (06/19 0006) Pulse Rate:  [72-90] 74 (06/19 0951) Resp:  [16-18] 18 (06/19 0951) BP: (105-127)/(71-90) 127/78 mmHg (06/19 0951) SpO2:  [98 %-100 %] 99 % (06/19 0951) Weight:  [137 lb (62.143 kg)] 137 lb (62.143 kg) (06/19 0006)   General Appearance:  Well developed, well nourished, no acute distress, alert and oriented x3 HEENT:  Normocephalic atraumatic, extraocular movements intact, moist mucous membranes Cardiovascular:  Normal S1/S2, regular rate and rhythm, no murmurs Pulmonary:  clear to auscultation, no wheezes, rales or rhonchi, symmetric air entry, good air exchange Abdomen:  Bowel sounds present, soft, mild diffuse tender but no rebound, nondistended, no abnormal masses, no epigastric pain Extremities:  Full range of motion, no pedal edema, 2+ distal pulses, no tenderness Skin:  normal coloration and turgor, no  rashes, no suspicious skin lesions noted  Neurologic:  Cranial nerves 2-12 grossly intact, normal muscle tone, strength 5/5 all four extremities Psychiatric:  Normal mood and affect, appropriate, no AH/VH Pelvic:  NEFG, no vulvar masses or lesions, normal vaginal mucosa, no vaginal bleeding or discharge, cervix without lesions or erythema, tender uterus, no adnexal masses  appreciated, limited by pain    Labs/Studies:   CBC and Coags:  Lab Results  Component Value Date   WBC 6.2 05/31/2016   HGB 12.0 05/31/2016   HCT 35.7 05/31/2016   MCV 86.5 05/31/2016   PLT 184 05/31/2016   CMP:  Lab Results  Component Value Date   NA 137 05/31/2016   K 3.5 05/31/2016   CL 106 05/31/2016   CO2 24 05/31/2016   BUN 11 05/31/2016   CREATININE 0.74 05/31/2016   PROT 7.4 05/31/2016   BILITOT 0.4 05/31/2016   ALT 11* 05/31/2016   AST 16 05/31/2016   ALKPHOS 40 05/31/2016    Other Imaging: US Transvaginal Non-ob  05/31/2016  CLINICAL DATA:  25 year old female with right pelvic pain. EXAM: TRANSABDOMINAL AND TRANSVAGINAL ULTRASOUND OF PELVIS DOPPLER ULTRASOUND OF OVARIES TECHNIQUE: Both transabdominal and transvaginal ultrasound examinations of the pelvis were performed. Transabdominal technique was performed for global imaging of the pelvis including uterus, ovaries, adnexal regions, and pelvic cul-de-sac. It was necessary to proceed with endovaginal exam following the transabdominal exam to visualize the endometrium and the ovaries. Color and duplex Doppler ultrasound was utilized to evaluate blood flow to the ovaries. COMPARISON:  Obstetrical ultrasound dated 04/28/2015 FINDINGS: Uterus Measurements: 8.5 x 4.4 x 5.2 cm. No fibroids or other mass visualized. Endometrium Thickness: 10 mm.  No focal abnormality visualized. Right ovary Measurements: 5.1 x 2.8 x 3.9 cm. Normal appearance/no adnexal mass. Left ovary Measurements: 4.1 x 2.4 x 2.9 cm. Normal appearance/no adnexal mass. Pulsed Doppler evaluation of both ovaries demonstrates normal low-resistance arterial and venous waveforms. Other findings There is a 6.2 x 4.7 x 4.3 cm heterogeneous and echogenic mass with no internal vascularity adjacent to the right ovary and anterior to the uterus. This most likely represent a blood clot. Other etiologies are not excluded. There is moderate amount of complex fluid within the  pelvis. Reported negative urine pregnancy test, and therefore the possibility of a ruptured ectopic pregnancy is less likely. Correlation with clinical exam and follow-up with ultrasound recommended. IMPRESSION: Unremarkable uterus and ovaries. Bilateral ovarian Doppler flow detected. Moderate complex free fluid within the pelvis. Echogenic mass in the right hemipelvis most likely represents blood clot. Correlation with clinical exam, and follow-up with ultrasound recommended. Electronically Signed   By: Anner Crete M.D.   On: 05/31/2016 05:40   US Pelvis Complete  05/31/2016  CLINICAL DATA:  24 year old female with right pelvic pain. EXAM: TRANSABDOMINAL AND TRANSVAGINAL ULTRASOUND OF PELVIS DOPPLER ULTRASOUND OF OVARIES TECHNIQUE: Both transabdominal and transvaginal ultrasound examinations of the pelvis were performed. Transabdominal technique was performed for global imaging of the pelvis including uterus, ovaries, adnexal regions, and pelvic cul-de-sac. It was necessary to proceed with endovaginal exam following the transabdominal exam to visualize the endometrium and the ovaries. Color and duplex Doppler ultrasound was utilized to evaluate blood flow to the ovaries. COMPARISON:  Obstetrical ultrasound dated 04/28/2015 FINDINGS: Uterus Measurements: 8.5 x 4.4 x 5.2 cm. No fibroids or other mass visualized. Endometrium Thickness: 10 mm.  No focal abnormality visualized. Right ovary Measurements: 5.1 x 2.8 x 3.9 cm. Normal appearance/no adnexal mass. Left ovary Measurements: 4.1 x 2.4  x 2.9 cm. Normal appearance/no adnexal mass. Pulsed Doppler evaluation of both ovaries demonstrates normal low-resistance arterial and venous waveforms. Other findings There is a 6.2 x 4.7 x 4.3 cm heterogeneous and echogenic mass with no internal vascularity adjacent to the right ovary and anterior to the uterus. This most likely represent a blood clot. Other etiologies are not excluded. There is moderate amount of complex  fluid within the pelvis. Reported negative urine pregnancy test, and therefore the possibility of a ruptured ectopic pregnancy is less likely. Correlation with clinical exam and follow-up with ultrasound recommended. IMPRESSION: Unremarkable uterus and ovaries. Bilateral ovarian Doppler flow detected. Moderate complex free fluid within the pelvis. Echogenic mass in the right hemipelvis most likely represents blood clot. Correlation with clinical exam, and follow-up with ultrasound recommended. Electronically Signed   By: Anner Crete M.D.   On: 05/31/2016 05:40   Ct Abdomen Pelvis W Contrast  05/31/2016  CLINICAL DATA:  25 year old female with a history of right lower quadrant pain EXAM: CT ABDOMEN AND PELVIS WITH CONTRAST TECHNIQUE: Multidetector CT imaging of the abdomen and pelvis was performed using the standard protocol following bolus administration of intravenous contrast. CONTRAST:  89mL ISOVUE-300 IOPAMIDOL (ISOVUE-300) INJECTION 61% COMPARISON:  Contemporaneous pelvic ultrasound FINDINGS: Lower chest: Unremarkable appearance of the soft tissues of the chest wall. Heart size within normal limits.  No pericardial fluid/thickening. No lower mediastinal adenopathy. Unremarkable appearance of the distal esophagus. No hiatal hernia. No confluent airspace disease, pleural fluid, or pneumothorax within visualized lung. Abdomen/pelvis: Unremarkable appearance of liver and spleen. Unremarkable appearance of bilateral adrenal glands. No peripancreatic or pericholecystic fluid or inflammatory changes. No radio-opaque gallstones. No intrahepatic or extrahepatic biliary ductal dilatation. Intermediate density fluid adjacent to the liver surface. Trace intermediate density fluid within the left pericolic gutter. Lobulated high density fluid anterior to the uterus associated with the right adnexa/ovary. Intermediate density layer dependently within the pelvis in the recto uterine space. No abnormally dilated small  bowel or colon. No transition point. No inflammatory changes of the mesenteries. Appendix is not visualized, however, no inflammatory changes are present adjacent to the cecum to indicate an appendicitis. Right Kidney/Ureter: No hydronephrosis. No nephrolithiasis. No perinephric stranding. Unremarkable course of the right ureter. Left Kidney/Ureter: No hydronephrosis. No nephrolithiasis. No perinephric stranding. Unremarkable course of the left ureter. Unremarkable appearance of the urinary bladder. Fluid within the endometrial canal. Intermediate density cystic structure associated with the cervix, most likely nabothian cyst measuring 12 mm (image 76). Rounded high density lobulated soft tissue/hemorrhage measuring 5.4 cm x 4.2 cm anterior to uterus and associated with the right adnexa/ ovary. There is adjacent crenulated enhancing structure within the adnexa. Adjacent intermediate and high density free fluid. Unremarkable appearance of the left adnexa. No significant vascular calcification. No aneurysm or periaortic fluid identified. Musculoskeletal: No displaced fracture identified. No significant degenerative changes of the spine. IMPRESSION: Lobulated high density soft tissue/fluid associated with the right adnexa anterior to the uterus, suggestive of focal hemorrhage, with associated intermediate density free fluid within the abdomen and pelvis. Most likely etiology is a hemorrhagic ovarian cyst given that the patient has a history of negative HCG (Electronic record). Referral for Ob GYN evaluation and interval follow-up pelvic ultrasound may be useful to assure resolution and potentially exclude endometriosis or underlying ovarian lesion. These results were called by telephone at the time of interpretation on 05/31/2016 at 9:20 am to Dr. Clearnce Hasten who verbally acknowledged these results. Signed, Dulcy Fanny. Earleen Newport, DO [facility]  Radiology Electronically Signed    By: Corrie Mckusick D.O.   On: 05/31/2016 09:24   [place] Art/ven Flow Abd Pelv Doppler  05/31/2016  CLINICAL DATA:  25 year old female with right pelvic pain. EXAM: TRANSABDOMINAL AND TRANSVAGINAL ULTRASOUND OF PELVIS DOPPLER ULTRASOUND OF OVARIES TECHNIQUE: Both transabdominal and transvaginal ultrasound examinations of the pelvis were performed. Transabdominal technique was performed for global imaging of the pelvis including uterus, ovaries, adnexal regions, and pelvic cul-de-sac. It was necessary to proceed with endovaginal exam following the transabdominal exam to visualize the endometrium and the ovaries. Color and duplex Doppler ultrasound was utilized to evaluate blood flow to the ovaries. COMPARISON:  Obstetrical ultrasound dated 04/28/2015 FINDINGS: Uterus Measurements: 8.5 x 4.4 x 5.2 cm. No fibroids or other mass visualized. Endometrium Thickness: 10 mm.  No focal abnormality visualized. Right ovary Measurements: 5.1 x 2.8 x 3.9 cm. Normal appearance/no adnexal mass. Left ovary Measurements: 4.1 x 2.4 x 2.9 cm. Normal appearance/no adnexal mass. Pulsed Doppler evaluation of both ovaries demonstrates normal low-resistance arterial and venous waveforms. Other findings There is a 6.2 x 4.7 x 4.3 cm heterogeneous and echogenic mass with no internal vascularity adjacent to the right ovary and anterior to the uterus. This most likely represent a blood clot. Other etiologies are not excluded. There is moderate amount of complex fluid within the pelvis. Reported negative urine pregnancy test, and therefore the possibility of a ruptured ectopic pregnancy is less likely. Correlation with clinical exam and follow-up with ultrasound recommended. IMPRESSION: Unremarkable uterus and ovaries. Bilateral ovarian Doppler flow detected. Moderate complex free fluid within the pelvis. Echogenic mass in the right hemipelvis most likely represents blood clot. Correlation with clinical exam, and follow-up with ultrasound  recommended. Electronically Signed   By: Anner Crete M.D.   On: 05/31/2016 05:40     Assessment / Plan:   Samantha Rose is a 25 y.o. G1P0010 who presents with likely ruptured hemorrhagic ovarian cyst. Ectopic ruled out by negative urine pregnancy test.  1. Hemoperitoneum: Stable vitals, sx and Hgb. However, given her pain with ambulation and the amount of blood in her pelvis and RLQ, I have discussed with her my opinion that she is likely not actively bleeding into her pelvis but that her discomfort is reason enough to investigate and evacuate the hemoperitoneum. I have given her the option of close f/u in the office and with ultrasound, with pain control at home vs dx laparoscopy. She has elected for surgery. We will add her on for tomorrow morning cases.    Thank you for the opportunity to be involved with this pt's care.

## 2016-05-31 NOTE — ED Provider Notes (Addendum)
Signout from Dr. Beather Arbour on this 25 year old female with right-sided abdominal pain. Found to have bacterial vaginosis and also likely blood in her pelvis. To follow-up on her CAT scan of the abdomen and pelvis and reassess. Physical Exam  BP 127/78 mmHg  Pulse 74  Temp(Src) 98 F (36.7 C) (Oral)  Resp 18  Ht 5\' 4"  (1.626 m)  Wt 137 lb (62.143 kg)  BMI 23.50 kg/m2  SpO2 99%  LMP 05/10/2016 (Approximate)  Physical Exam Patient resting without any distress. However, requesting pain meds at this time. ED Course  Procedures      CT Abdomen Pelvis W Contrast (Final result) Result time: 05/31/16 09:24:29   Final result by Rad Results In Interface (05/31/16 09:24:29)   Narrative:   CLINICAL DATA: 25 year old female with a history of right lower quadrant pain  EXAM: CT ABDOMEN AND PELVIS WITH CONTRAST  TECHNIQUE: Multidetector CT imaging of the abdomen and pelvis was performed using the standard protocol following bolus administration of intravenous contrast.  CONTRAST: 74mL ISOVUE-300 IOPAMIDOL (ISOVUE-300) INJECTION 61%  COMPARISON: Contemporaneous pelvic ultrasound  FINDINGS: Lower chest:  Unremarkable appearance of the soft tissues of the chest wall.  Heart size within normal limits. No pericardial fluid/thickening.  No lower mediastinal adenopathy.  Unremarkable appearance of the distal esophagus.  No hiatal hernia.  No confluent airspace disease, pleural fluid, or pneumothorax within visualized lung.  Abdomen/pelvis:  Unremarkable appearance of liver and spleen.  Unremarkable appearance of bilateral adrenal glands.  No peripancreatic or pericholecystic fluid or inflammatory changes.  No radio-opaque gallstones.  No intrahepatic or extrahepatic biliary ductal dilatation.  Intermediate density fluid adjacent to the liver surface. Trace intermediate density fluid within the left pericolic gutter.  Lobulated high density fluid anterior to the uterus  associated with the right adnexa/ovary. Intermediate density layer dependently within the pelvis in the recto uterine space.  No abnormally dilated small bowel or colon. No transition point. No inflammatory changes of the mesenteries.  Appendix is not visualized, however, no inflammatory changes are present adjacent to the cecum to indicate an appendicitis.  Right Kidney/Ureter:  No hydronephrosis. No nephrolithiasis. No perinephric stranding. Unremarkable course of the right ureter.  Left Kidney/Ureter:  No hydronephrosis. No nephrolithiasis. No perinephric stranding.  Unremarkable course of the left ureter.  Unremarkable appearance of the urinary bladder.  Fluid within the endometrial canal.  Intermediate density cystic structure associated with the cervix, most likely nabothian cyst measuring 12 mm (image 76).  Rounded high density lobulated soft tissue/hemorrhage measuring 5.4 cm x 4.2 cm anterior to uterus and associated with the right adnexa/ ovary. There is adjacent crenulated enhancing structure within the adnexa. Adjacent intermediate and high density free fluid.  Unremarkable appearance of the left adnexa.  No significant vascular calcification. No aneurysm or periaortic fluid identified.  Musculoskeletal:  No displaced fracture identified.  No significant degenerative changes of the spine.  IMPRESSION: Lobulated high density soft tissue/fluid associated with the right adnexa anterior to the uterus, suggestive of focal hemorrhage, with associated intermediate density free fluid within the abdomen and pelvis. Most likely etiology is a hemorrhagic ovarian cyst given that the patient has a history of negative HCG (Electronic record). Referral for Ob GYN evaluation and interval follow-up pelvic ultrasound may be useful to assure resolution and potentially exclude endometriosis or underlying ovarian lesion.  These results were called by telephone at the time  of interpretation on 05/31/2016 at 9:20 am to Dr. Clearnce Hasten who verbally acknowledged these results.  Signed,  Dulcy Fanny. Earleen Newport,  DO  [facility]  [facility]   Electronically Signed By: Corrie Mckusick D.O. On: 05/31/2016 09:24          US Transvaginal Non-OB (Final result) Result time: 05/31/16 05:40:21   Final result by Rad Results In Interface (05/31/16 05:40:21)   Narrative:   CLINICAL DATA: 25 year old female with right pelvic pain.  EXAM: TRANSABDOMINAL AND TRANSVAGINAL ULTRASOUND OF PELVIS  DOPPLER ULTRASOUND OF OVARIES  TECHNIQUE: Both transabdominal and transvaginal ultrasound examinations of the pelvis were performed. Transabdominal technique was performed for global imaging of the pelvis including uterus, ovaries, adnexal regions, and pelvic cul-de-sac.  It was necessary to proceed with endovaginal exam following the transabdominal exam to visualize the endometrium and the ovaries. Color and duplex Doppler ultrasound was utilized to evaluate blood flow to the ovaries.  COMPARISON: Obstetrical ultrasound dated 04/28/2015  FINDINGS: Uterus  Measurements: 8.5 x 4.4 x 5.2 cm. No fibroids or other mass visualized.  Endometrium  Thickness: 10 mm. No focal abnormality visualized.  Right ovary  Measurements: 5.1 x 2.8 x 3.9 cm. Normal appearance/no adnexal mass.  Left ovary  Measurements: 4.1 x 2.4 x 2.9 cm. Normal appearance/no adnexal mass.  Pulsed Doppler evaluation of both ovaries demonstrates normal low-resistance arterial and venous waveforms.  Other findings  There is a 6.2 x 4.7 x 4.3 cm heterogeneous and echogenic mass with no internal vascularity adjacent to the right ovary and anterior to the uterus. This most likely represent a blood clot. Other etiologies are not excluded. There is moderate amount of complex fluid within the pelvis. Reported negative urine pregnancy test,  and therefore the possibility of a ruptured ectopic pregnancy is less likely. Correlation with clinical exam and follow-up with ultrasound recommended.  IMPRESSION: Unremarkable uterus and ovaries.  Bilateral ovarian Doppler flow detected.  Moderate complex free fluid within the pelvis. Echogenic mass in the right hemipelvis most likely represents blood clot. Correlation with clinical exam, and follow-up with ultrasound recommended.   Electronically Signed By: Anner Crete M.D. On: 05/31/2016 05:40          US Pelvis Complete (Final result) Result time: 05/31/16 05:40:21   Final result by Rad Results In Interface (05/31/16 05:40:21)   Narrative:   CLINICAL DATA: 25 year old female with right pelvic pain.  EXAM: TRANSABDOMINAL AND TRANSVAGINAL ULTRASOUND OF PELVIS  DOPPLER ULTRASOUND OF OVARIES  TECHNIQUE: Both transabdominal and transvaginal ultrasound examinations of the pelvis were performed. Transabdominal technique was performed for global imaging of the pelvis including uterus, ovaries, adnexal regions, and pelvic cul-de-sac.  It was necessary to proceed with endovaginal exam following the transabdominal exam to visualize the endometrium and the ovaries. Color and duplex Doppler ultrasound was utilized to evaluate blood flow to the ovaries.  COMPARISON: Obstetrical ultrasound dated 04/28/2015  FINDINGS: Uterus  Measurements: 8.5 x 4.4 x 5.2 cm. No fibroids or other mass visualized.  Endometrium  Thickness: 10 mm. No focal abnormality visualized.  Right ovary  Measurements: 5.1 x 2.8 x 3.9 cm. Normal appearance/no adnexal mass.  Left ovary  Measurements: 4.1 x 2.4 x 2.9 cm. Normal appearance/no adnexal mass.  Pulsed Doppler evaluation of both ovaries demonstrates normal low-resistance arterial and venous waveforms.  Other findings  There is a 6.2 x 4.7 x 4.3 cm heterogeneous and echogenic mass with no internal vascularity  adjacent to the right ovary and anterior to the uterus. This most likely represent a blood clot. Other etiologies are not excluded. There is moderate amount of complex fluid within the pelvis.  Reported negative urine pregnancy test, and therefore the possibility of a ruptured ectopic pregnancy is less likely. Correlation with clinical exam and follow-up with ultrasound recommended.  IMPRESSION: Unremarkable uterus and ovaries.  Bilateral ovarian Doppler flow detected.  Moderate complex free fluid within the pelvis. Echogenic mass in the right hemipelvis most likely represents blood clot. Correlation with clinical exam, and follow-up with ultrasound recommended.   Electronically Signed By: Anner Crete M.D. On: 05/31/2016 05:40          [place] Art/Ven Flow Abd Pelv Doppler (Final result) Result time: 05/31/16 05:40:21   Final result by Rad Results In Interface (05/31/16 05:40:21)   Narrative:   CLINICAL DATA: 25 year old female with right pelvic pain.  EXAM: TRANSABDOMINAL AND TRANSVAGINAL ULTRASOUND OF PELVIS  DOPPLER ULTRASOUND OF OVARIES  TECHNIQUE: Both transabdominal and transvaginal ultrasound examinations of the pelvis were performed. Transabdominal technique was performed for global imaging of the pelvis including uterus, ovaries, adnexal regions, and pelvic cul-de-sac.  It was necessary to proceed with endovaginal exam following the transabdominal exam to visualize the endometrium and the ovaries. Color and duplex Doppler ultrasound was utilized to evaluate blood flow to the ovaries.  COMPARISON: Obstetrical ultrasound dated 04/28/2015  FINDINGS: Uterus  Measurements: 8.5 x 4.4 x 5.2 cm. No fibroids or other mass visualized.  Endometrium  Thickness: 10 mm. No focal abnormality visualized.  Right ovary  Measurements: 5.1 x 2.8 x 3.9 cm. Normal appearance/no adnexal mass.  Left ovary  Measurements: 4.1 x 2.4 x 2.9 cm. Normal  appearance/no adnexal mass.  Pulsed Doppler evaluation of both ovaries demonstrates normal low-resistance arterial and venous waveforms.  Other findings  There is a 6.2 x 4.7 x 4.3 cm heterogeneous and echogenic mass with no internal vascularity adjacent to the right ovary and anterior to the uterus. This most likely represent a blood clot. Other etiologies are not excluded. There is moderate amount of complex fluid within the pelvis. Reported negative urine pregnancy test, and therefore the possibility of a ruptured ectopic pregnancy is less likely. Correlation with clinical exam and follow-up with ultrasound recommended.  IMPRESSION: Unremarkable uterus and ovaries.  Bilateral ovarian Doppler flow detected.  Moderate complex free fluid within the pelvis. Echogenic mass in the right hemipelvis most likely represents blood clot. Correlation with clinical exam, and follow-up with ultrasound recommended.   Electronically Signed By: Anner Crete M.D. On: 05/31/2016 05:40        MDM ----------------------------------------- 11:51 AM on 05/31/2016 -----------------------------------------  Patient seen and evaluated by Dr. Trixie Rude of OB/GYN who is waiting for the patient to ambulate and also will reassess the patient after her OR case. Question is whether the patient we'll to follow-up in the office or if she needs to go to surgery at this time.    ----------------------------------------- 12:20 PM on 05/31/2016 -----------------------------------------  Patient still with significant amount of pain to the abdomen when walking. Dr. Leafy Ro will be admitting the patient for laparoscopic surgery.  Orbie Pyo, MD 05/31/16 1220  Dr. Leafy Ro reevaluated the patient and we'll be discharging the patient home. The patient will be following up at 11 AM tomorrow for her surgery. She knows not to eat or drink anything after midnight tonight and before the  surgery tomorrow. She knows where to go for preop tomorrow at 11 AM. Will be discharged home with Percocet 5 mg. The patient says that she hallucinated before with 10 mg tabs but is fine with 5 mg tabs. She understands the plan and is willing to comply.  No distress at this time. Appears comfortable sitting on the bed.  Orbie Pyo, MD 05/31/16 [id number]

## 2016-05-31 NOTE — ED Notes (Signed)
Pt states rlq pain that began at 1200 yesterday. Pt denies vomiting, nausea, chills, diarrhea, vaginal discharge or vaginal bleeding. Pt appears in no acute distress in triage. resps unlabored.

## 2016-05-31 NOTE — ED Provider Notes (Signed)
[facility] Emergency Department Provider Note   ____________________________________________  Time seen: Approximately 3:14 AM  I have reviewed the triage vital signs and the nursing notes.   HISTORY  Chief Complaint Abdominal Pain    HPI Samantha Rose is a 25 y.o. female who presents to the ED from home with a chief complaint of right pelvic pain. Patient reports pain since approximately noon yesterday. Complains of nonradiating crampy/sharp right adnexal pain. Denies associated symptoms of fever, chills, chest pain, shortness of breath, nausea, vomiting, diarrhea. Denies vaginal discharge or bleeding. Denies recent travel or trauma. Nothing makes her pain better or worse.   Past medical history None  There are no active problems to display for this patient.   Past Surgical History  Procedure Laterality Date  . Breast cyst excision Right     Current Outpatient Rx  Name  Route  Sig  Dispense  Refill  . cyclobenzaprine (FLEXERIL) 5 MG tablet   Oral   Take 1 tablet (5 mg total) by mouth every 8 (eight) hours as needed for muscle spasms.   30 tablet   1   . ibuprofen (ADVIL,MOTRIN) 800 MG tablet   Oral   Take 1 tablet (800 mg total) by mouth every 8 (eight) hours as needed.   30 tablet   0     Allergies Review of patient's allergies indicates no known allergies.  No family history on file.  Social History Social History  Substance Use Topics  . Smoking status: Current Every Day Smoker    Types: Cigarettes  . Smokeless tobacco: Never Used  . Alcohol Use: No    Review of Systems  Constitutional: No fever/chills. Eyes: No visual changes. ENT: No sore throat. Cardiovascular: Denies chest pain. Respiratory: Denies shortness of breath. Gastrointestinal: Positive for pelvic pain. No abdominal pain.  No nausea, no vomiting.  No diarrhea.  No constipation. Genitourinary: Negative for dysuria. Musculoskeletal: Negative for back  pain. Skin: Negative for rash. Neurological: Negative for headaches, focal weakness or numbness.  10-point ROS otherwise negative.  ____________________________________________   PHYSICAL EXAM:  VITAL SIGNS: ED Triage Vitals  Enc Vitals Group     BP 05/31/16 0006 123/73 mmHg     Pulse Rate 05/31/16 0006 90     Resp 05/31/16 0006 16     Temp 05/31/16 0006 98 F (36.7 C)     Temp Source 05/31/16 0006 Oral     SpO2 05/31/16 0006 100 %     Weight 05/31/16 0006 137 lb (62.143 kg)     Height 05/31/16 0006 5\' 4"  (1.626 m)     Head Cir --      Peak Flow --      Pain Score 05/31/16 0007 7     Pain Loc --      Pain Edu? --      Excl. in [place]? --     Constitutional: Alert and oriented. Well appearing and in no acute distress. Eyes: Conjunctivae are normal. PERRL. EOMI. Head: Atraumatic. Nose: No congestion/rhinnorhea. Mouth/Throat: Mucous membranes are moist.  Oropharynx non-erythematous. Neck: No stridor.   Cardiovascular: Normal rate, regular rhythm. Grossly normal heart sounds.  Good peripheral circulation. Respiratory: Normal respiratory effort.  No retractions. Lungs CTAB. Gastrointestinal: Soft and mildly tender to palpation right pelvis without rebound or guarding. No distention. No abdominal bruits. No CVA tenderness. Musculoskeletal: No lower extremity tenderness nor edema.  No joint effusions. Neurologic:  Normal speech and language. No gross focal neurologic deficits  are appreciated. No gait instability. Skin:  Skin is warm, dry and intact. No rash noted. Psychiatric: Mood and affect are normal. Speech and behavior are normal.  ____________________________________________   LABS (all labs ordered are listed, but only abnormal results are displayed)  Labs Reviewed  WET PREP, GENITAL - Abnormal; Notable for the following:    Clue Cells Wet Prep HPF POC PRESENT (*)    WBC, Wet Prep HPF POC FEW (*)    All other components within normal limits  COMPREHENSIVE METABOLIC  PANEL - Abnormal; Notable for the following:    Glucose, Bld 111 (*)    ALT 11 (*)    All other components within normal limits  URINALYSIS COMPLETEWITH MICROSCOPIC ([facility] ONLY) - Abnormal; Notable for the following:    Color, Urine YELLOW (*)    APPearance HAZY (*)    Squamous Epithelial / LPF 6-30 (*)    All other components within normal limits  CHLAMYDIA/NGC RT PCR ([facility] ONLY)  CBC  HCG, QUANTITATIVE, PREGNANCY  POC URINE PREG, ED  POCT PREGNANCY, URINE   ____________________________________________  EKG  None ____________________________________________  RADIOLOGY  Pelvic ultrasound interpreted per Dr. Quintella Reichert: Unremarkable uterus and ovaries.  Bilateral ovarian Doppler flow detected.  Moderate complex free fluid within the pelvis. Echogenic mass in the right hemipelvis most likely represents blood clot. Correlation with clinical exam, and follow-up with ultrasound recommended. ____________________________________________   PROCEDURES  Procedure(s) performed:   Pelvic exam: External exam WNL without rashes, lesions or vesicles. Speculum exam reveals mild white discharge. No bleeding. Cervical os closed. Bimanual exam reveals mild right adnexal tenderness.  Critical Care performed: No  ____________________________________________   INITIAL IMPRESSION / ASSESSMENT AND PLAN / ED COURSE  Pertinent labs & imaging results that were available during my care of the patient were reviewed by me and considered in my medical decision making (see chart for details).  25 year old female who presents with right pelvic pain 14 hours. She is afebrile with normal WBC, no anorexia or nausea/vomiting. Lower suspicion for appendicitis. Will start with pelvic exam and pelvic ultrasound to evaluate for pelvic pathology.  ----------------------------------------- 6:49 AM on 05/31/2016 -----------------------------------------  Patient asleep. Awakened to updated  patient and family member of wet prep and ultrasound results. Unsure what to make of pelvic fluid and echogenic mass; will proceed with CT abdomen/pelvis for further characterization.  ----------------------------------------- 7:37 AM on 05/31/2016 -----------------------------------------  Patient drinking contrast progression for CT scan. Care transferred to Dr. Clearnce Hasten. Disposition pending CT scan. ____________________________________________   FINAL CLINICAL IMPRESSION(S) / ED DIAGNOSES  Final diagnoses:  Pain  Bacterial vaginosis  Pelvic fluid collection      NEW MEDICATIONS STARTED DURING THIS VISIT:  New Prescriptions   No medications on file     Note:  This document was prepared using Dragon voice recognition software and may include unintentional dictation errors.    Paulette Blanch, MD 05/31/16 [phone]

## 2016-05-31 NOTE — ED Notes (Signed)
Dr. Leafy Ro at bedside

## 2016-06-01 ENCOUNTER — Ambulatory Visit: Payer: Managed Care, Other (non HMO) | Admitting: Anesthesiology

## 2016-06-01 ENCOUNTER — Encounter: Payer: Self-pay | Admitting: *Deleted

## 2016-06-01 ENCOUNTER — Encounter
Admission: RE | Disposition: A | Discharge status: Home / Self Care | Payer: Self-pay | Source: Ambulatory Visit | Attending: Obstetrics and Gynecology

## 2016-06-01 ENCOUNTER — Ambulatory Visit
Admission: RE | Admit: 2016-06-01 | Discharge: 2016-06-01 | Disposition: A | Discharge status: Home / Self Care | Payer: Managed Care, Other (non HMO) | Source: Ambulatory Visit | Attending: Obstetrics and Gynecology | Admitting: Obstetrics and Gynecology

## 2016-06-01 DIAGNOSIS — N83201 Unspecified ovarian cyst, right side: Secondary | ICD-10-CM | POA: Insufficient documentation

## 2016-06-01 DIAGNOSIS — N83209 Unspecified ovarian cyst, unspecified side: Secondary | ICD-10-CM | POA: Diagnosis present

## 2016-06-01 DIAGNOSIS — F17200 Nicotine dependence, unspecified, uncomplicated: Secondary | ICD-10-CM | POA: Insufficient documentation

## 2016-06-01 DIAGNOSIS — K661 Hemoperitoneum: Secondary | ICD-10-CM | POA: Insufficient documentation

## 2016-06-01 HISTORY — PX: LAPAROSCOPY: SHX197

## 2016-06-01 LAB — BASIC METABOLIC PANEL
ANION GAP: 5 (ref 5–15)
BUN: 11 mg/dL (ref 6–20)
CALCIUM: 9.3 mg/dL (ref 8.9–10.3)
CHLORIDE: 107 mmol/L (ref 101–111)
CO2: 25 mmol/L (ref 22–32)
Creatinine, Ser: 0.72 mg/dL (ref 0.44–1.00)
GFR calc non Af Amer: >60 mL/min (ref >60)
GLUCOSE: 79 mg/dL (ref 65–99)
POTASSIUM: 4.1 mmol/L (ref 3.5–5.1)
Sodium: 137 mmol/L (ref 135–145)

## 2016-06-01 LAB — TYPE AND SCREEN
ABO/RH(D): O POS
ANTIBODY SCREEN: NEGATIVE

## 2016-06-01 LAB — CBC
HEMATOCRIT: 39.4 % (ref 35.0–47.0)
HEMOGLOBIN: 13.3 g/dL (ref 12.0–16.0)
MCH: 29.6 pg (ref 26.0–34.0)
MCHC: 33.7 g/dL (ref 32.0–36.0)
MCV: 87.9 fL (ref 80.0–100.0)
Platelets: 184 10*3/uL (ref 150–440)
RBC: 4.48 MIL/uL (ref 3.80–5.20)
RDW: 13.9 % (ref 11.5–14.5)
WBC: 4.4 10*3/uL (ref 3.6–11.0)

## 2016-06-01 SURGERY — LAPAROSCOPY, DIAGNOSTIC
Anesthesia: General | Site: Abdomen | Wound class: Clean Contaminated

## 2016-06-01 MED ORDER — EVICEL 5 ML EX KIT
PACK | CUTANEOUS | Status: AC
  Filled 2016-06-01: qty 1

## 2016-06-01 MED ORDER — BUPIVACAINE HCL (PF) 0.5 % IJ SOLN
INTRAMUSCULAR | Status: AC
  Filled 2016-06-01: qty 30

## 2016-06-01 MED ORDER — PROPOFOL 10 MG/ML IV BOLUS
INTRAVENOUS | Status: DC | PRN
  Administered 2016-06-01: 150 mg via INTRAVENOUS

## 2016-06-01 MED ORDER — ACETAMINOPHEN 10 MG/ML IV SOLN
INTRAVENOUS | Status: DC | PRN
  Administered 2016-06-01: 1000 mg via INTRAVENOUS

## 2016-06-01 MED ORDER — FENTANYL CITRATE (PF) 100 MCG/2ML IJ SOLN
INTRAMUSCULAR | Status: DC | PRN
  Administered 2016-06-01 (×4): 50 ug via INTRAVENOUS
  Administered 2016-06-01: 100 ug via INTRAVENOUS

## 2016-06-01 MED ORDER — FENTANYL CITRATE (PF) 100 MCG/2ML IJ SOLN
INTRAMUSCULAR | Status: AC
  Administered 2016-06-01: 25 ug via INTRAVENOUS
  Filled 2016-06-01: qty 2

## 2016-06-01 MED ORDER — BUPIVACAINE HCL 0.5 % IJ SOLN
INTRAMUSCULAR | Status: DC | PRN
  Administered 2016-06-01: 20 mL

## 2016-06-01 MED ORDER — OXYCODONE-ACETAMINOPHEN 5-325 MG PO TABS
ORAL_TABLET | ORAL | Status: AC
  Filled 2016-06-01: qty 1

## 2016-06-01 MED ORDER — MIDAZOLAM HCL 5 MG/5ML IJ SOLN
INTRAMUSCULAR | Status: DC | PRN
  Administered 2016-06-01: 2 mg via INTRAVENOUS

## 2016-06-01 MED ORDER — SUGAMMADEX SODIUM 200 MG/2ML IV SOLN
INTRAVENOUS | Status: DC | PRN
  Administered 2016-06-01: 120 mg via INTRAVENOUS

## 2016-06-01 MED ORDER — LACTATED RINGERS IV SOLN
INTRAVENOUS | Status: DC
  Administered 2016-06-01: 13:00:00 via INTRAVENOUS

## 2016-06-01 MED ORDER — EVICEL 5 ML EX KIT
PACK | CUTANEOUS | Status: DC | PRN
  Administered 2016-06-01: 1

## 2016-06-01 MED ORDER — ONDANSETRON HCL 4 MG/2ML IJ SOLN: 4.0000 mg | Freq: Once | INTRAMUSCULAR | Status: DC | PRN

## 2016-06-01 MED ORDER — DEXAMETHASONE SODIUM PHOSPHATE 10 MG/ML IJ SOLN
INTRAMUSCULAR | Status: DC | PRN
  Administered 2016-06-01: 5 mg via INTRAVENOUS

## 2016-06-01 MED ORDER — LIDOCAINE HCL (CARDIAC) 20 MG/ML IV SOLN
INTRAVENOUS | Status: DC | PRN
  Administered 2016-06-01: 60 mg via INTRAVENOUS

## 2016-06-01 MED ORDER — OXYCODONE-ACETAMINOPHEN 5-325 MG PO TABS
1.0000 | ORAL_TABLET | Freq: Four times a day (QID) | ORAL | Status: DC | PRN
Start: 2016-06-01 — End: 2016-06-01
  Administered 2016-06-01: 1 via ORAL

## 2016-06-01 MED ORDER — FENTANYL CITRATE (PF) 100 MCG/2ML IJ SOLN
25.0000 ug | INTRAMUSCULAR | Status: AC | PRN
  Administered 2016-06-01 (×6): 25 ug via INTRAVENOUS

## 2016-06-01 MED ORDER — OXYCODONE-ACETAMINOPHEN 5-325 MG PO TABS: 1.0000 | ORAL_TABLET | Freq: Four times a day (QID) | ORAL | Status: DC | PRN

## 2016-06-01 MED ORDER — ROCURONIUM BROMIDE 100 MG/10ML IV SOLN
INTRAVENOUS | Status: DC | PRN
  Administered 2016-06-01: 25 mg via INTRAVENOUS

## 2016-06-01 MED ORDER — METHYLENE BLUE 0.5 % INJ SOLN
INTRAVENOUS | Status: AC
Start: 2016-06-01 — End: 2016-06-01
  Filled 2016-06-01: qty 10

## 2016-06-01 MED ORDER — ONDANSETRON HCL 4 MG/2ML IJ SOLN
INTRAMUSCULAR | Status: DC | PRN
  Administered 2016-06-01: 4 mg via INTRAVENOUS

## 2016-06-01 MED ORDER — ACETAMINOPHEN 10 MG/ML IV SOLN
INTRAVENOUS | Status: AC
  Filled 2016-06-01: qty 100

## 2016-06-01 SURGICAL SUPPLY — 42 items
BAG URO DRAIN 2000ML W/SPOUT (MISCELLANEOUS) ×3 IMPLANT
BLADE SURG SZ11 CARB STEEL (BLADE) ×3 IMPLANT
CATH FOLEY 2WAY  5CC 16FR (CATHETERS) ×2
CATH ROBINSON RED A/P 16FR (CATHETERS) ×3 IMPLANT
CATH URTH 16FR FL 2W BLN LF (CATHETERS) ×1 IMPLANT
CHLORAPREP W/TINT 26ML (MISCELLANEOUS) ×3 IMPLANT
CLOSURE WOUND 1/4X4 (GAUZE/BANDAGES/DRESSINGS) ×1
DEFOGGER SCOPE WARMER CLEARIFY (MISCELLANEOUS) ×3 IMPLANT
DRAPE UTILITY 15X26 TOWEL STRL (DRAPES) ×3 IMPLANT
DRSG TEGADERM 2-3/8X2-3/4 SM (GAUZE/BANDAGES/DRESSINGS) ×3 IMPLANT
ENDOPOUCH RETRIEVER 10 (MISCELLANEOUS) IMPLANT
GAUZE SPONGE NON-WVN 2X2 STRL (MISCELLANEOUS) ×1 IMPLANT
GLOVE BIO SURGEON STRL SZ 6.5 (GLOVE) ×6 IMPLANT
GLOVE BIO SURGEONS STRL SZ 6.5 (GLOVE) ×3
GLOVE INDICATOR 7.0 STRL GRN (GLOVE) ×6 IMPLANT
GOWN STRL REUS W/ TWL LRG LVL3 (GOWN DISPOSABLE) ×3 IMPLANT
GOWN STRL REUS W/TWL LRG LVL3 (GOWN DISPOSABLE) ×6
IRRIGATION STRYKERFLOW (MISCELLANEOUS) ×1 IMPLANT
IRRIGATOR STRYKERFLOW (MISCELLANEOUS) ×3
IV SOD CHL 0.9% 1000ML (IV SOLUTION) ×3 IMPLANT
KIT RM TURNOVER CYSTO AR (KITS) ×3 IMPLANT
LABEL OR SOLS (LABEL) ×3 IMPLANT
LIGASURE 5MM LAPAROSCOPIC (INSTRUMENTS) IMPLANT
LIQUID BAND (GAUZE/BANDAGES/DRESSINGS) ×3 IMPLANT
NS IRRIG 500ML POUR BTL (IV SOLUTION) ×3 IMPLANT
PACK GYN LAPAROSCOPIC (MISCELLANEOUS) ×3 IMPLANT
PAD OB MATERNITY 4.3X12.25 (PERSONAL CARE ITEMS) ×3 IMPLANT
PAD PREP 24X41 OB/GYN DISP (PERSONAL CARE ITEMS) ×3 IMPLANT
SCISSORS METZENBAUM CVD 33 (INSTRUMENTS) IMPLANT
SLEEVE ENDOPATH XCEL 5M (ENDOMECHANICALS) ×6 IMPLANT
SPONGE VERSALON 2X2 STRL (MISCELLANEOUS) ×2
STRIP CLOSURE SKIN 1/4X4 (GAUZE/BANDAGES/DRESSINGS) ×2 IMPLANT
SUT MNCRL AB 4-0 PS2 18 (SUTURE) ×3 IMPLANT
SUT VIC AB 2-0 UR6 27 (SUTURE) ×3 IMPLANT
SUT VIC AB 4-0 SH 27 (SUTURE)
SUT VIC AB 4-0 SH 27XANBCTRL (SUTURE) IMPLANT
SWABSTK COMLB BENZOIN TINCTURE (MISCELLANEOUS) ×3 IMPLANT
TIP RIGID 35CM EVICEL (HEMOSTASIS) ×3 IMPLANT
TROCAR ENDO BLADELESS 11MM (ENDOMECHANICALS) ×3 IMPLANT
TROCAR XCEL NON-BLD 5MMX100MML (ENDOMECHANICALS) ×3 IMPLANT
TROCAR XCEL UNIV SLVE 11M 100M (ENDOMECHANICALS) IMPLANT
TUBING INSUFFLATOR HI FLOW (MISCELLANEOUS) ×3 IMPLANT

## 2016-06-01 NOTE — Discharge Instructions (Signed)
Laparoscopic Ovarian Surgery Discharge Instructions ° °RISKS AND COMPLICATIONS  °· Infection. °· Bleeding. °· Injury to surrounding organs. °· Anesthetic side effects. ° ° °PROCEDURE  °· You may be given a medicine to help you relax (sedative) before the procedure. You will be given a medicine to make you sleep (general anesthetic) during the procedure. °· A tube will be put down your throat to help your breath while under general anesthesia. °· Several small cuts (incisions) are made in the lower abdominal area and one incision is made near the belly button. °· Your abdominal area will be inflated with a safe gas (carbon dioxide). This helps give the surgeon room to operate, visualize, and helps the surgeon avoid other organs. °· A thin, lighted tube (laparoscope) with a camera attached is inserted into your abdomen through the incision near the belly button. Other small instruments may also be inserted through other abdominal incisions. °· The ovary is located and are removed. °· After the ovary is removed, the gas is released from the abdomen. °· The incisions will be closed with stitches (sutures), and Dermabond. A bandage may be placed over the incisions. ° °AFTER THE PROCEDURE  °· You will also have some mild abdominal discomfort for 3-7 days. You will be given pain medicine to ease any discomfort. °· As long as there are no problems, you may be allowed to go home. Someone will need to drive you home and be with you for at least 24 hours once home. °· You may have some mild discomfort in the throat. This is from the tube placed in your throat while you were sleeping. °· You may experience discomfort in the shoulder area from some trapped air between the liver and diaphragm. This sensation is normal and will slowly go away on its own. ° °HOME CARE INSTRUCTIONS  °· Take all medicines as directed. °· Only take over-the-counter or prescription medicines for pain, discomfort, or fever as directed by your  caregiver. °· Resume daily activities as directed. °· Showers are preferred over baths for 2 weeks. °· You may resume sexual activities in 1 week or as you feel you would like to. °· Do not drive while taking narcotics. ° °SEEK MEDICAL CARE IF: . °· There is increasing abdominal pain. °· You feel lightheaded or faint. °· You have the chills. °· You have an oral temperature above 102° F (38.9° C). °· There is pus-like (purulent) drainage from any of the wounds. °· You are unable to pass gas or have a bowel movement. °· You feel sick to your stomach (nauseous) or throw up (vomit) and can't control it with your medicines. ° °MAKE SURE YOU:  °· Understand these instructions. °· Will watch your condition. °· Will get help right away if you are not doing well or get worse. ° °[employer]® Patient Information ©2013 [employer]. ° ° ° ° °AMBULATORY SURGERY  °DISCHARGE INSTRUCTIONS ° ° °1) The drugs that you were given will stay in your system until tomorrow so for the next 24 hours you should not: ° °A) Drive an automobile °B) Make any legal decisions °C) Drink any alcoholic beverage ° ° °2) You may resume regular meals tomorrow.  Today it is better to start with liquids and gradually work up to solid foods. ° °You may eat anything you prefer, but it is better to start with liquids, then soup and crackers, and gradually work up to solid foods. ° ° °3) Please notify your doctor immediately if you   have any unusual bleeding, trouble breathing, redness and pain at the surgery site, drainage, fever, or pain not relieved by medication.    4) Additional Instructions:        Please contact your physician with any problems or Same Day Surgery at [phone], Monday through Friday 6 am to 4 pm, or [place] at [facility] number at [phone].

## 2016-06-01 NOTE — H&P (Signed)
SERVICE: Gynecology   Patient Name: Samantha Rose Patient MRN: [id number]  CC: Acute right lower quadrant pain  HPI: Samantha Rose is a 25 y.o. G1P0010 with acute onset of RLQ pain yesterday. It is constant, moderate to severe, R>L and improved with oxycodone. Some diarrhea x1, no n/v/c/fever, anorexia. Difficulty with ambulation because of pain. Not dizzy, not tachycardic. No vaginal bleeding.  Imaging revealed complex fluid in pelvis and tracking up right paracolic gutter, with an adnexal mass. Beta quant was negative. Hgb stable, with 13->12 over twelve hours.  Sexually active with female partner, no contraception.  Hx of hallucinations with Percocet.  OBHx: [id number] with prior SAB No prior surgeries   Review of Systems: positives in bold GEN:  fevers, chills, weight changes, appetite changes, fatigue, night sweats HEENT: HA, vision changes, hearing loss, congestion, rhinorrhea, sinus pressure, dysphagia CV:  CP, palpitations PULM: SOB, cough GI: abd pain, N/V/D/C GU: dysuria, urgency, frequency MSK: arthralgias, myalgias, back pain, swelling SKIN: rashes, color changes, pallor NEURO: numbness, weakness, tingling, seizures, dizziness, tremors PSYCH: depression, anxiety, behavioral problems, confusion  HEME/LYMPH: easy bruising or bleeding ENDO: heat/cold intolerance  Past Obstetrical History: OB History    Gravida Para Term Preterm AB TAB SAB Ectopic Multiple Living   1 0     0         Past Gynecologic History: Patient's last menstrual period was 05/10/2016 (approximate). Menstrual frequency Q 4 wks lasting 4 days. No dysmenorrhea or menorrhagia.   Past Medical History: History reviewed. No pertinent past medical history.  Past Surgical History:  Past Surgical History  Procedure Laterality Date  . Breast cyst excision Right     Family History:  family history is not on file.  Social History:   Social History   Social History  . Marital Status: Single    Spouse Name: N/A  . Number of Children: N/A  . Years of Education: N/A   Occupational History  . Not on file.   Social History Main Topics  . Smoking status: Current Every Day Smoker    Types: Cigarettes  . Smokeless tobacco: Never Used  . Alcohol Use: No  . Drug Use: No  . Sexual Activity: Yes   Other Topics Concern  . Not on file   Social History Narrative    Home Medications:  Medications reconciled in EPIC  No current facility-administered medications on file prior to encounter.   No current outpatient prescriptions on file prior to encounter.    Allergies:  No Known Allergies  Physical Exam:  Temp: [98 F (36.7 C)] 98 F (36.7 C) (06/19 0006) Pulse Rate: [72-90] 74 (06/19 0951) Resp: [16-18] 18 (06/19 0951) BP: (105-127)/(71-90) 127/78 mmHg (06/19 0951) SpO2: [98 %-100 %] 99 % (06/19 0951) Weight: [137 lb (62.143 kg)] 137 lb (62.143 kg) (06/19 0006)   General Appearance: Well developed, well nourished, no acute distress, alert and oriented x3 HEENT: Normocephalic atraumatic, extraocular movements intact, moist mucous membranes Cardiovascular: Normal S1/S2, regular rate and rhythm, no murmurs Pulmonary: clear to auscultation, no wheezes, rales or rhonchi, symmetric air entry, good air exchange Abdomen: Bowel sounds present, soft, mild diffuse tender but no rebound, nondistended, no abnormal masses, no epigastric pain Extremities: Full range of motion, no pedal edema, 2+ distal pulses, no tenderness Skin: normal coloration and turgor, no rashes, no suspicious skin lesions noted  Neurologic: Cranial nerves 2-12 grossly intact, normal muscle tone, strength 5/5 all four extremities Psychiatric: Normal mood and affect, appropriate, no AH/VH Pelvic: NEFG,  no vulvar masses or lesions, normal vaginal mucosa, no vaginal bleeding or  discharge, cervix without lesions or erythema, tender uterus, no adnexal masses appreciated, limited by pain    Labs/Studies:   CBC and Coags:  Recent Labs    Lab Results  Component Value Date   WBC 6.2 05/31/2016   HGB 12.0 05/31/2016   HCT 35.7 05/31/2016   MCV 86.5 05/31/2016   PLT 184 05/31/2016     CMP:  Recent Labs    Lab Results  Component Value Date   NA 137 05/31/2016   K 3.5 05/31/2016   CL 106 05/31/2016   CO2 24 05/31/2016   BUN 11 05/31/2016   CREATININE 0.74 05/31/2016   PROT 7.4 05/31/2016   BILITOT 0.4 05/31/2016   ALT 11* 05/31/2016   AST 16 05/31/2016   ALKPHOS 40 05/31/2016      Other Imaging:  Imaging Results    US Transvaginal Non-ob  05/31/2016 CLINICAL DATA: 25 year old female with right pelvic pain. EXAM: TRANSABDOMINAL AND TRANSVAGINAL ULTRASOUND OF PELVIS DOPPLER ULTRASOUND OF OVARIES TECHNIQUE: Both transabdominal and transvaginal ultrasound examinations of the pelvis were performed. Transabdominal technique was performed for global imaging of the pelvis including uterus, ovaries, adnexal regions, and pelvic cul-de-sac. It was necessary to proceed with endovaginal exam following the transabdominal exam to visualize the endometrium and the ovaries. Color and duplex Doppler ultrasound was utilized to evaluate blood flow to the ovaries. COMPARISON: Obstetrical ultrasound dated 04/28/2015 FINDINGS: Uterus Measurements: 8.5 x 4.4 x 5.2 cm. No fibroids or other mass visualized. Endometrium Thickness: 10 mm. No focal abnormality visualized. Right ovary Measurements: 5.1 x 2.8 x 3.9 cm. Normal appearance/no adnexal mass. Left ovary Measurements: 4.1 x 2.4 x 2.9 cm. Normal appearance/no adnexal mass. Pulsed Doppler evaluation of both ovaries demonstrates normal low-resistance arterial and venous waveforms. Other findings There is a 6.2 x 4.7 x 4.3 cm heterogeneous and echogenic  mass with no internal vascularity adjacent to the right ovary and anterior to the uterus. This most likely represent a blood clot. Other etiologies are not excluded. There is moderate amount of complex fluid within the pelvis. Reported negative urine pregnancy test, and therefore the possibility of a ruptured ectopic pregnancy is less likely. Correlation with clinical exam and follow-up with ultrasound recommended. IMPRESSION: Unremarkable uterus and ovaries. Bilateral ovarian Doppler flow detected. Moderate complex free fluid within the pelvis. Echogenic mass in the right hemipelvis most likely represents blood clot. Correlation with clinical exam, and follow-up with ultrasound recommended. Electronically Signed By: Anner Crete M.D. On: 05/31/2016 05:40   US Pelvis Complete  05/31/2016 CLINICAL DATA: 25 year old female with right pelvic pain. EXAM: TRANSABDOMINAL AND TRANSVAGINAL ULTRASOUND OF PELVIS DOPPLER ULTRASOUND OF OVARIES TECHNIQUE: Both transabdominal and transvaginal ultrasound examinations of the pelvis were performed. Transabdominal technique was performed for global imaging of the pelvis including uterus, ovaries, adnexal regions, and pelvic cul-de-sac. It was necessary to proceed with endovaginal exam following the transabdominal exam to visualize the endometrium and the ovaries. Color and duplex Doppler ultrasound was utilized to evaluate blood flow to the ovaries. COMPARISON: Obstetrical ultrasound dated 04/28/2015 FINDINGS: Uterus Measurements: 8.5 x 4.4 x 5.2 cm. No fibroids or other mass visualized. Endometrium Thickness: 10 mm. No focal abnormality visualized. Right ovary Measurements: 5.1 x 2.8 x 3.9 cm. Normal appearance/no adnexal mass. Left ovary Measurements: 4.1 x 2.4 x 2.9 cm. Normal appearance/no adnexal mass. Pulsed Doppler evaluation of both ovaries demonstrates normal low-resistance arterial and venous waveforms. Other findings There is a 6.2 x  4.7 x 4.3 cm  heterogeneous and echogenic mass with no internal vascularity adjacent to the right ovary and anterior to the uterus. This most likely represent a blood clot. Other etiologies are not excluded. There is moderate amount of complex fluid within the pelvis. Reported negative urine pregnancy test, and therefore the possibility of a ruptured ectopic pregnancy is less likely. Correlation with clinical exam and follow-up with ultrasound recommended. IMPRESSION: Unremarkable uterus and ovaries. Bilateral ovarian Doppler flow detected. Moderate complex free fluid within the pelvis. Echogenic mass in the right hemipelvis most likely represents blood clot. Correlation with clinical exam, and follow-up with ultrasound recommended. Electronically Signed By: Anner Crete M.D. On: 05/31/2016 05:40   Ct Abdomen Pelvis W Contrast  05/31/2016 CLINICAL DATA: 25 year old female with a history of right lower quadrant pain EXAM: CT ABDOMEN AND PELVIS WITH CONTRAST TECHNIQUE: Multidetector CT imaging of the abdomen and pelvis was performed using the standard protocol following bolus administration of intravenous contrast. CONTRAST: 68mL ISOVUE-300 IOPAMIDOL (ISOVUE-300) INJECTION 61% COMPARISON: Contemporaneous pelvic ultrasound FINDINGS: Lower chest: Unremarkable appearance of the soft tissues of the chest wall. Heart size within normal limits. No pericardial fluid/thickening. No lower mediastinal adenopathy. Unremarkable appearance of the distal esophagus. No hiatal hernia. No confluent airspace disease, pleural fluid, or pneumothorax within visualized lung. Abdomen/pelvis: Unremarkable appearance of liver and spleen. Unremarkable appearance of bilateral adrenal glands. No peripancreatic or pericholecystic fluid or inflammatory changes. No radio-opaque gallstones. No intrahepatic or extrahepatic biliary ductal dilatation. Intermediate density fluid adjacent to the liver surface. Trace intermediate density fluid within  the left pericolic gutter. Lobulated high density fluid anterior to the uterus associated with the right adnexa/ovary. Intermediate density layer dependently within the pelvis in the recto uterine space. No abnormally dilated small bowel or colon. No transition point. No inflammatory changes of the mesenteries. Appendix is not visualized, however, no inflammatory changes are present adjacent to the cecum to indicate an appendicitis. Right Kidney/Ureter: No hydronephrosis. No nephrolithiasis. No perinephric stranding. Unremarkable course of the right ureter. Left Kidney/Ureter: No hydronephrosis. No nephrolithiasis. No perinephric stranding. Unremarkable course of the left ureter. Unremarkable appearance of the urinary bladder. Fluid within the endometrial canal. Intermediate density cystic structure associated with the cervix, most likely nabothian cyst measuring 12 mm (image 76). Rounded high density lobulated soft tissue/hemorrhage measuring 5.4 cm x 4.2 cm anterior to uterus and associated with the right adnexa/ ovary. There is adjacent crenulated enhancing structure within the adnexa. Adjacent intermediate and high density free fluid. Unremarkable appearance of the left adnexa. No significant vascular calcification. No aneurysm or periaortic fluid identified. Musculoskeletal: No displaced fracture identified. No significant degenerative changes of the spine. IMPRESSION: Lobulated high density soft tissue/fluid associated with the right adnexa anterior to the uterus, suggestive of focal hemorrhage, with associated intermediate density free fluid within the abdomen and pelvis. Most likely etiology is a hemorrhagic ovarian cyst given that the patient has a history of negative HCG (Electronic record). Referral for Ob GYN evaluation and interval follow-up pelvic ultrasound may be useful to assure resolution and potentially exclude endometriosis or underlying ovarian lesion. These results were called by telephone at  the time of interpretation on 05/31/2016 at 9:20 am to Dr. Clearnce Hasten who verbally acknowledged these results. Signed, Dulcy Fanny. Earleen Newport, DO [facility] Electronically Signed By: Corrie Mckusick D.O. On: 05/31/2016 09:24   [place] Art/ven Flow Abd Pelv Doppler  05/31/2016 CLINICAL DATA: 25 year old female with right pelvic pain. EXAM: TRANSABDOMINAL AND TRANSVAGINAL ULTRASOUND OF PELVIS DOPPLER  ULTRASOUND OF OVARIES TECHNIQUE: Both transabdominal and transvaginal ultrasound examinations of the pelvis were performed. Transabdominal technique was performed for global imaging of the pelvis including uterus, ovaries, adnexal regions, and pelvic cul-de-sac. It was necessary to proceed with endovaginal exam following the transabdominal exam to visualize the endometrium and the ovaries. Color and duplex Doppler ultrasound was utilized to evaluate blood flow to the ovaries. COMPARISON: Obstetrical ultrasound dated 04/28/2015 FINDINGS: Uterus Measurements: 8.5 x 4.4 x 5.2 cm. No fibroids or other mass visualized. Endometrium Thickness: 10 mm. No focal abnormality visualized. Right ovary Measurements: 5.1 x 2.8 x 3.9 cm. Normal appearance/no adnexal mass. Left ovary Measurements: 4.1 x 2.4 x 2.9 cm. Normal appearance/no adnexal mass. Pulsed Doppler evaluation of both ovaries demonstrates normal low-resistance arterial and venous waveforms. Other findings There is a 6.2 x 4.7 x 4.3 cm heterogeneous and echogenic mass with no internal vascularity adjacent to the right ovary and anterior to the uterus. This most likely represent a blood clot. Other etiologies are not excluded. There is moderate amount of complex fluid within the pelvis. Reported negative urine pregnancy test, and therefore the possibility of a ruptured ectopic pregnancy is less likely. Correlation with clinical exam and follow-up with ultrasound recommended. IMPRESSION: Unremarkable uterus and ovaries.  Bilateral ovarian Doppler flow detected. Moderate complex free fluid within the pelvis. Echogenic mass in the right hemipelvis most likely represents blood clot. Correlation with clinical exam, and follow-up with ultrasound recommended. Electronically Signed By: Anner Crete M.D. On: 05/31/2016 05:40      Assessment / Plan:   Samantha Rose is a 25 y.o. G1P0010 who presents with likely ruptured hemorrhagic ovarian cyst. Ectopic ruled out by negative urine pregnancy test.  1. Hemoperitoneum: Stable vitals, sx and Hgb. However, given her pain with ambulation and the amount of blood in her pelvis and RLQ, I have discussed with her my opinion that she is likely not actively bleeding into her pelvis but that her discomfort is reason enough to investigate and evacuate the hemoperitoneum. I have given her the option of close f/u in the office and with ultrasound, with pain control at home vs dx laparoscopy. She has elected for surgery. We will add her on for this morning cases.

## 2016-06-01 NOTE — Anesthesia Procedure Notes (Signed)
Procedure Name: Intubation Date/Time: 06/01/2016 1:42 PM Performed by: Dionne Bucy Pre-anesthesia Checklist: Patient identified, Patient being monitored, Timeout performed, Emergency Drugs available and Suction available Patient Re-evaluated:Patient Re-evaluated prior to inductionOxygen Delivery Method: Circle system utilized Preoxygenation: Pre-oxygenation with 100% oxygen Intubation Type: IV induction Ventilation: Mask ventilation without difficulty Laryngoscope Size: Mac and 3 Grade View: Grade I Tube type: Oral Tube size: 7.0 mm Number of attempts: 1 Airway Equipment and Method: Stylet Placement Confirmation: ETT inserted through vocal cords under direct vision,  positive ETCO2 and breath sounds checked- equal and bilateral Secured at: 21 cm Tube secured with: Tape Dental Injury: Teeth and Oropharynx as per pre-operative assessment

## 2016-06-01 NOTE — Op Note (Signed)
Valli Glance PROCEDURE DATE: 06/01/2016  PREOPERATIVE DIAGNOSIS: Suspected ruptured hemorrhagic ovarian cyst POSTOPERATIVE DIAGNOSIS: ruptured hemorrhagic ovarian cyst; significant hemoperitoneum PROCEDURE: Diagnostic laparoscopy, evacuation of hemoperitoneum SURGEON:  Dr. Benjaman Kindler ASSISTANT: RN-FA ANESTHESIOLOGIST: Iver Nestle, MD Anesthesiologist: Iver Nestle, MD CRNA: Demetrius Charity, CRNA; Dionne Bucy, CRNA  INDICATIONS: 25 y.o. G1P0 with history of acute onset pelvic pain with significant complex pelvic fluid on imaging.   Please see preoperative notes for further details.  Risks of surgery were discussed with the patient including but not limited to: bleeding which may require transfusion or reoperation; infection which may require antibiotics; injury to bowel, bladder, ureters or other surrounding organs; need for additional procedures including laparotomy; thromboembolic phenomenon, incisional problems and other postoperative/anesthesia complications. Written informed consent was obtained.    FINDINGS:  Small uterus with an irregular fundal contour suggestive of intramural fibroid, normal left ovary and normal fallopian tubes bilaterally. Right ovary with a still bleeding ruptured cyst. No other abdominal/pelvic abnormality.  Normal upper abdomen. Pictures taken  ANESTHESIA:    General INTRAVENOUS FLUIDS: 600 ml ESTIMATED BLOOD LOSS: minimal. Hemoperitoneum: 230mL evacuated URINE OUTPUT: 100 ml SPECIMENS: None COMPLICATIONS: None immediate  PROCEDURE IN DETAIL:  The patient had sequential compression devices applied to her lower extremities while in the preoperative area.  She was then taken to the operating room where general anesthesia was administered and was found to be adequate.  She was placed in the dorsal lithotomy position, and was prepped and draped in a sterile manner.  A straight catheter was inserted into her bladder ana uterine  manipulator was then advanced into the uterus .  After an adequate timeout was performed, attention was turned to the abdomen where an umbilical incision was made with the scalpel.  The Optiview 5-mm trocar and sleeve were then advanced without difficulty with the laparoscope under direct visualization into the abdomen.  The abdomen was then insufflated with carbon dioxide gas and adequate pneumoperitoneum was obtained.   A detailed survey of the patient's pelvis and abdomen revealed the findings as mentioned above.  Bilateral 92mm ports were advanced under direct visualization. Hemoperitoneum was evacuated and the pelvis and upper abdomen was irrigated with 1L of fluid. The bleeding cyst was fulgurated with Kleppingers; however, continued bleeding prompted me to place a hemostatic agent over the right ovary, with excellent results. The operative site was surveyed, and it was found to be hemostatic.  81ml of 0.5% Marcaine was infused over the ovary. No intraoperative injury to surrounding organs was noted.  Pictures were taken of the quadrants and pelvis. The abdomen was desufflated and all instruments were then removed from the patient's abdomen. The uterine manipulator was removed without complications.  All incisions were closed with 4-0 Vicryl and Dermabond.   The patient tolerated the procedures well.  All instruments, needles, and sponge counts were correct x 2. The patient was taken to the recovery room in stable condition.

## 2016-06-01 NOTE — Transfer of Care (Signed)
Immediate Anesthesia Transfer of Care Note  Patient: Samantha Rose  Procedure(s) Performed: Procedure(s): LAPAROSCOPY DIAGNOSTIC (N/A)  Patient Location: PACU  Anesthesia Type:General  Level of Consciousness: awake and patient cooperative  Airway & Oxygen Therapy: Patient Spontanous Breathing and Patient connected to face mask oxygen  Post-op Assessment: Report given to RN  Post vital signs: Reviewed and stable  Last Vitals:  Filed Vitals:   06/01/16 1213 06/01/16 1510  BP: 116/64 116/70  Pulse: 69 89  Temp: 36.5 C 36.3 C  Resp: 14 23    Last Pain:  Filed Vitals:   06/01/16 1511  PainSc: 4          Complications: No apparent anesthesia complications

## 2016-06-01 NOTE — Anesthesia Preprocedure Evaluation (Signed)
Anesthesia Evaluation  Patient identified by MRN, date of birth, ID band Patient awake    Reviewed: Allergy & Precautions, NPO status , Patient's Chart, lab work & pertinent test results  History of Anesthesia Complications Negative for: history of anesthetic complications  Airway Mallampati: II       Dental  (+) Teeth Intact   Pulmonary Current Smoker,    breath sounds clear to auscultation       Cardiovascular Exercise Tolerance: Good  Rhythm:Regular     Neuro/Psych    GI/Hepatic negative GI ROS, Neg liver ROS,   Endo/Other  negative endocrine ROSHypothyroidism   Renal/GU negative Renal ROS     Musculoskeletal negative musculoskeletal ROS (+)   Abdominal Normal abdominal exam  (+)   Peds negative pediatric ROS (+)  Hematology   Anesthesia Other Findings   Reproductive/Obstetrics                             Anesthesia Physical Anesthesia Plan  ASA: II  Anesthesia Plan: General   Post-op Pain Management:    Induction: Intravenous  Airway Management Planned: Oral ETT  Additional Equipment:   Intra-op Plan:   Post-operative Plan: Extubation in OR  Informed Consent: I have reviewed the patients History and Physical, chart, labs and discussed the procedure including the risks, benefits and alternatives for the proposed anesthesia with the patient or authorized representative who has indicated his/her understanding and acceptance.     Plan Discussed with: CRNA  Anesthesia Plan Comments:         Anesthesia Quick Evaluation

## 2016-06-02 ENCOUNTER — Encounter: Payer: Self-pay | Admitting: Obstetrics and Gynecology

## 2016-06-02 NOTE — Anesthesia Postprocedure Evaluation (Signed)
Anesthesia Post Note  Patient: Samantha Rose  Procedure(s) Performed: Procedure(s) (LRB): LAPAROSCOPY DIAGNOSTIC (N/A)  Patient location during evaluation: PACU Anesthesia Type: General Level of consciousness: awake Pain management: satisfactory to patient Vital Signs Assessment: post-procedure vital signs reviewed and stable Respiratory status: nonlabored ventilation Anesthetic complications: no    Last Vitals:  Filed Vitals:   06/01/16 1625 06/01/16 1704  BP: 107/54 114/54  Pulse: 68 76  Temp: 36.3 C   Resp: 20 16    Last Pain:  Filed Vitals:   06/01/16 1708  PainSc: 5                  VAN STAVEREN,Samwise Eckardt

## 2016-08-29 ENCOUNTER — Encounter: Payer: Self-pay | Admitting: Emergency Medicine

## 2016-08-29 ENCOUNTER — Emergency Department
Admission: EM | Admit: 2016-08-29 | Discharge: 2016-08-29 | Disposition: A | Discharge status: Home / Self Care | Payer: Managed Care, Other (non HMO) | Attending: Emergency Medicine | Admitting: Emergency Medicine

## 2016-08-29 DIAGNOSIS — Z87891 Personal history of nicotine dependence: Secondary | ICD-10-CM | POA: Diagnosis not present

## 2016-08-29 DIAGNOSIS — B373 Candidiasis of vulva and vagina: Secondary | ICD-10-CM | POA: Insufficient documentation

## 2016-08-29 DIAGNOSIS — T7840XA Allergy, unspecified, initial encounter: Secondary | ICD-10-CM | POA: Diagnosis present

## 2016-08-29 DIAGNOSIS — Z3A01 Less than 8 weeks gestation of pregnancy: Secondary | ICD-10-CM | POA: Diagnosis not present

## 2016-08-29 DIAGNOSIS — O23591 Infection of other part of genital tract in pregnancy, first trimester: Secondary | ICD-10-CM | POA: Insufficient documentation

## 2016-08-29 DIAGNOSIS — B3731 Acute candidiasis of vulva and vagina: Secondary | ICD-10-CM

## 2016-08-29 LAB — WET PREP, GENITAL
Clue Cells Wet Prep HPF POC: NONE SEEN
Sperm: NONE SEEN
Trich, Wet Prep: NONE SEEN

## 2016-08-29 LAB — URINALYSIS COMPLETE WITH MICROSCOPIC (ARMC ONLY)
Bacteria, UA: NONE SEEN
Bilirubin Urine: NEGATIVE
Glucose, UA: NEGATIVE mg/dL
HGB URINE DIPSTICK: NEGATIVE
KETONES UR: NEGATIVE mg/dL
Nitrite: NEGATIVE
PH: 7 (ref 5.0–8.0)
PROTEIN: NEGATIVE mg/dL
SPECIFIC GRAVITY, URINE: 1.014 (ref 1.005–1.030)

## 2016-08-29 LAB — POCT PREGNANCY, URINE: Preg Test, Ur: POSITIVE — AB

## 2016-08-29 LAB — CHLAMYDIA/NGC RT PCR (ARMC ONLY)
Chlamydia Tr: NOT DETECTED
N gonorrhoeae: NOT DETECTED

## 2016-08-29 MED ORDER — DIPHENHYDRAMINE HCL 2 % EX CREA
TOPICAL_CREAM | Freq: Three times a day (TID) | CUTANEOUS | 0 refills | Status: DC | PRN
Start: 2016-08-29 — End: 2017-02-20

## 2016-08-29 MED ORDER — MICONAZOLE NITRATE 1200 & 2 MG & % VA KIT: 1.0000 | PACK | Freq: Once | VAGINAL | 0 refills | Status: AC

## 2016-08-29 NOTE — ED Provider Notes (Signed)
[facility] Emergency Department Provider Note  ____________________________________________   I have reviewed the triage vital signs and the nursing notes.   HISTORY  Chief Complaint Allergic Reaction    HPI Samantha Rose is a 25 y.o. female who is approximately [redacted] weeks pregnant. She states she has an IUP by prior ultrasound. She is G2 P1 with a prior miscarriage. She had an ultrasound 3 days ago and since that time, almost to me of the afternoon of percent she had a burning sensation to the left labial region. She denies any vaginal bleeding or spotting. She has no pelvic pain. She has no pain unless she touches that area. She is worried she has an allergic reaction because of the lubrication on the ultrasound. She has no pelvic discomfort. She has no pain of any variety unless she touches that area. She denies dysuria or urinary frequency and is been there for 3 days, not 6 weeks   History reviewed. No pertinent past medical history.  There are no active problems to display for this patient.   Past Surgical History:  Procedure Laterality Date  . BREAST CYST EXCISION Right   . LAPAROSCOPY N/A 06/01/2016   Procedure: LAPAROSCOPY DIAGNOSTIC;  Surgeon: Benjaman Kindler, MD;  Location: [facility] ORS;  Service: Gynecology;  Laterality: N/A;    Prior to Admission medications   Medication Sig Start Date End Date Taking? Authorizing Provider  oxyCODONE-acetaminophen (PERCOCET/ROXICET) 5-325 MG tablet Take 1 tablet by mouth every 6 (six) hours as needed for moderate pain or severe pain. 06/01/16   Benjaman Kindler, MD  oxyCODONE-acetaminophen (ROXICET) 5-325 MG tablet Take 1 tablet by mouth every 6 (six) hours as needed. 05/31/16   Orbie Pyo, MD    Allergies Review of patient's allergies indicates no known allergies.  No family history on file.  Social History Social History  Substance Use Topics  . Smoking status: Former Smoker    Types: Cigarettes   . Smokeless tobacco: Never Used  . Alcohol use No    Review of Systems Constitutional: No fever/chills Eyes: No visual changes. ENT: No sore throat. No stiff neck no neck pain Cardiovascular: Denies chest pain. Respiratory: Denies shortness of breath. Gastrointestinal:   no vomiting.  No diarrhea.  No constipation. Genitourinary: Negative for dysuria. Musculoskeletal: Negative lower extremity swelling Skin: Negative for rash. Neurological: Negative for severe headaches, focal weakness or numbness. 10-point ROS otherwise negative.  ____________________________________________   PHYSICAL EXAM:  VITAL SIGNS: ED Triage Vitals  Enc Vitals Group     BP 08/29/16 1957 136/85     Pulse Rate 08/29/16 1957 (!) 102     Resp 08/29/16 1957 18     Temp 08/29/16 1957 98.5 F (36.9 C)     Temp Source 08/29/16 1957 Oral     SpO2 08/29/16 1957 100 %     Weight 08/29/16 1957 143 lb (64.9 kg)     Height 08/29/16 1957 5\' 3"  (1.6 m)     Head Circumference --      Peak Flow --      Pain Score 08/29/16 1958 0     Pain Loc --      Pain Edu? --      Excl. in [place]? --     Constitutional: Alert and oriented. Well appearing and in no acute distress. Eyes: Conjunctivae are normal. PERRL. EOMI. Head: Atraumatic. Nose: No congestion/rhinnorhea. Mouth/Throat: Mucous membranes are moist.  Oropharynx non-erythematous. Neck: No stridor.   Nontender with no meningismus  Cardiovascular: Normal rate, regular rhythm. Grossly normal heart sounds.  Good peripheral circulation. Respiratory: Normal respiratory effort.  No retractions. Lungs CTAB. Abdominal: Soft and nontender. No distention. No guarding no rebound [id number] exam: Female nurse chaperone present, no external lesions noted, physiologic vaginal discharge noted with no purulent discharge, no cervical motion tenderness, no adnexal tenderness or mass, there is no significant uterine tenderness or mass. No vaginal bleeding there is no evidence of any  pathology to the left fold with the patient feels her discomfort. There is no herpetic lesions. There is no erythema it is slightly tender to touch but is not indurated, and I cannot see any difference between the side that seems to cause her pain and the other side. Back:  There is no focal tenderness or step off.  there is no midline tenderness there are no lesions noted. there is no CVA tenderness Musculoskeletal: No lower extremity tenderness, no upper extremity tenderness. No joint effusions, no DVT signs strong distal pulses no edema Neurologic:  Normal speech and language. No gross focal neurologic deficits are appreciated.  Skin:  Skin is warm, dry and intact. No rash noted. Psychiatric: Mood and affect are normal. Speech and behavior are normal.  ____________________________________________   LABS (all labs ordered are listed, but only abnormal results are displayed)  Labs Reviewed  CHLAMYDIA/NGC RT PCR ([facility] ONLY)  WET PREP, GENITAL  URINALYSIS COMPLETEWITH MICROSCOPIC ([facility] ONLY)  POC URINE PREG, ED   ____________________________________________  EKG  I personally interpreted any EKGs ordered by me or triage  ____________________________________________  RADIOLOGY  I reviewed any imaging ordered by me or triage that were performed during my shift and, if possible, patient and/or family made aware of any abnormal findings. ____________________________________________   PROCEDURES  Procedure(s) performed: None  Procedures  Critical Care performed: None  ____________________________________________   INITIAL IMPRESSION / ASSESSMENT AND PLAN / ED COURSE  Pertinent labs & imaging results that were available during my care of the patient were reviewed by me and considered in my medical decision making (see chart for details).  Pertinent patient here presents after having an ultrasound and then had a burning sensation to the left labia. Unclear exactly  what this is from. I don't see any evidence of herpes I don't see any evidence of allergic reaction or infection. She has no pain unless you touch that area. This does not appear to be consistent with a pregnancy related pathology. I will check vaginal cultures as a precaution although I see no evidence of acute STI. We will check a urine and reassess.    Clinical Course   ____________________________________________   FINAL CLINICAL IMPRESSION(S) / ED DIAGNOSES  Final diagnoses:  None      This chart was dictated using voice recognition software.  Despite best efforts to proofread,  errors can occur which can change meaning.      Schuyler Amor, MD 08/29/16 2042

## 2016-08-29 NOTE — ED Notes (Signed)
NAD noted at time of D/C. Pt denies questions or concerns. Pt ambulatory to the lobby at this time.  

## 2016-08-29 NOTE — ED Triage Notes (Signed)
Pt presents to ED with c/o vaginal burning after she had a vaginal ultrasound on Wednesday. Pt states she had been asked if she has a latex allergy prior to inserting the ultrasound probe with a latex condom in place. Pt currently [redacted] weeks pregnant. Denies abnormal discharge, bleeding, or swelling. No other complaints.

## 2016-08-31 LAB — URINE CULTURE

## 2016-09-21 LAB — OB RESULTS CONSOLE RUBELLA ANTIBODY, IGM: Rubella: IMMUNE

## 2016-09-21 LAB — OB RESULTS CONSOLE HEPATITIS B SURFACE ANTIGEN: Hepatitis B Surface Ag: NEGATIVE

## 2016-09-21 LAB — OB RESULTS CONSOLE HIV ANTIBODY (ROUTINE TESTING): HIV: NONREACTIVE

## 2016-09-21 LAB — OB RESULTS CONSOLE VARICELLA ZOSTER ANTIBODY, IGG: VARICELLA IGG: IMMUNE

## 2016-09-22 ENCOUNTER — Other Ambulatory Visit: Payer: Self-pay | Admitting: Obstetrics and Gynecology

## 2016-09-22 DIAGNOSIS — Z369 Encounter for antenatal screening, unspecified: Secondary | ICD-10-CM

## 2016-10-07 ENCOUNTER — Encounter: Payer: Self-pay | Admitting: *Deleted

## 2016-10-07 ENCOUNTER — Ambulatory Visit
Admission: RE | Admit: 2016-10-07 | Discharge: 2016-10-07 | Disposition: A | Discharge status: Home / Self Care | Payer: Managed Care, Other (non HMO) | Source: Ambulatory Visit | Attending: Obstetrics and Gynecology | Admitting: Obstetrics and Gynecology

## 2016-10-07 ENCOUNTER — Ambulatory Visit (HOSPITAL_BASED_OUTPATIENT_CLINIC_OR_DEPARTMENT_OTHER)
Admission: RE | Admit: 2016-10-07 | Discharge: 2016-10-07 | Disposition: A | Discharge status: Home / Self Care | Payer: Managed Care, Other (non HMO) | Source: Ambulatory Visit | Attending: Obstetrics & Gynecology | Admitting: Obstetrics & Gynecology

## 2016-10-07 DIAGNOSIS — Z369 Encounter for antenatal screening, unspecified: Secondary | ICD-10-CM

## 2016-10-07 DIAGNOSIS — Z3A12 12 weeks gestation of pregnancy: Secondary | ICD-10-CM | POA: Insufficient documentation

## 2016-10-07 NOTE — Progress Notes (Addendum)
Referring physician:  [facility] Ob[facility] Length of Consultation: 40 minutes   Samantha Rose  was referred to [facility] for genetic counseling to review prenatal screening and testing options.  This note summarizes the information we discussed.    We offered the following routine screening tests for this pregnancy:  First trimester screening, which includes nuchal translucency ultrasound screen and first trimester maternal serum marker screening.  The nuchal translucency has approximately an 80% detection rate for Down syndrome and can be positive for other chromosome abnormalities as well as congenital heart defects.  When combined with a maternal serum marker screening, the detection rate is up to 90% for Down syndrome and up to 97% for trisomy 18.     Maternal serum marker screening, a blood test that measures pregnancy proteins, can provide risk assessments for Down syndrome, trisomy 18, and open neural tube defects (spina bifida, anencephaly). Because it does not directly examine the fetus, it cannot positively diagnose or rule out these problems.  Targeted ultrasound uses high frequency sound waves to create an image of the developing fetus.  An ultrasound is often recommended as a routine means of evaluating the pregnancy.  It is also used to screen for fetal anatomy problems (for example, a heart defect) that might be suggestive of a chromosomal or other abnormality.   Should these screening tests indicate an increased concern, then the following additional testing options would be offered:  The chorionic villus sampling procedure is available for first trimester chromosome analysis.  This involves the withdrawal of a small amount of chorionic villi (tissue from the developing placenta).  Risk of pregnancy loss is estimated to be approximately 1 in 200 to 1 in 100 (0.5 to 1%).  There is approximately a 1% (1 in 100) chance that the CVS chromosome results will be unclear.   Chorionic villi cannot be tested for neural tube defects.     Amniocentesis involves the removal of a small amount of amniotic fluid from the sac surrounding the fetus with the use of a thin needle inserted through the maternal abdomen and uterus.  Ultrasound guidance is used throughout the procedure.  Fetal cells from amniotic fluid are directly evaluated and > 99.5% of chromosome problems and > 98% of open neural tube defects can be detected. This procedure is generally performed after the 15th week of pregnancy.  The main risks to this procedure include complications leading to miscarriage in less than 1 in 200 cases (0.5%).  As another option for information if the pregnancy is suspected to be an an increased chance for certain chromosome conditions, we also reviewed the availability of cell free fetal DNA testing from maternal blood to determine whether or not the baby may have either Down syndrome, trisomy 18, or trisomy 32.  This test utilizes a maternal blood sample and DNA sequencing technology to isolate circulating cell free fetal DNA from maternal plasma.  The fetal DNA can then be analyzed for DNA sequences that are derived from the three most common chromosomes involved in aneuploidy, chromosomes 13, 18, and 21.  If the overall amount of DNA is greater than the expected level for any of these chromosomes, aneuploidy is suspected.  While we do not consider it a replacement for invasive testing and karyotype analysis, a negative result from this testing would be reassuring, though not a guarantee of a normal chromosome complement for the baby.  An abnormal result is certainly suggestive of an abnormal chromosome complement, though  we would still recommend CVS or amniocentesis to confirm any findings from this testing.   Cystic Fibrosis and Spinal Muscular Atrophy (SMA) screening were also discussed with the patient. Both conditions are recessive, which means that both parents must be carriers in  order to have a child with the disease.  Cystic fibrosis (CF) is one of the most common genetic conditions in persons of Caucasian ancestry.  This condition occurs in approximately 1 in 2,500 Caucasian persons and results in thickened secretions in the lungs, digestive, and reproductive systems.  For a baby to be at risk for having CF, both of the parents must be carriers for this condition.  Approximately 1 in 1 Caucasian persons is a carrier for CF.  Current carrier testing looks for the most common mutations in the gene for CF and can detect approximately 90% of carriers in the Caucasian population.  This means that the carrier screening can greatly reduce, but cannot eliminate, the chance for an individual to have a child with CF.  If an individual is found to be a carrier for CF, then carrier testing would be available for the partner. As part of [place] newborn screening profile, all babies born in the state of [place] will have a two-tier screening process.  Specimens are first tested to determine the concentration of immunoreactive trypsinogen (IRT).  The top 5% of specimens with the highest IRT values then undergo DNA testing using a panel of over 40 common CF mutations. SMA is a neurodegenerative disorder that leads to atrophy of skeletal muscle and overall weakness.  This condition is also more prevalent in the Caucasian population, with 1 in 40-1 in 60 persons being a carrier and 1 in 6,000-1 in 10,000 children being affected.  There are multiple forms of the disease, with some causing death in infancy to other forms with survival into adulthood.  The genetics of SMA is complex, but carrier screening can detect up to 95% of carriers in the Caucasian population.  Similar to CF, a negative result can greatly reduce, but cannot eliminate, the chance to have a child with SMA.  We obtained a detailed family history and pregnancy history.  The family history is remarkable for polydactyly in  the patient's brother's daughter (though that child's mother also had polydactyly) and in the patient's maternal grandmother.  We talked about the fact that polydactyly most often occurs as an isolated birth difference that can be inherited in families with up to a 50% chance to recur in the children of someone with polydactyly.  It can also be present as part of many genetic syndromes, but in the absence of other birth differences or developmental delays in the affected family members, a genetic syndrome is unlikely.  The risks to this pregnancy are likely small given the fact that Samantha Rose does not have any extra digits.  The remainder of the family history was reported to be unremarkable for birth defects, mental retardation, recurrent pregnancy loss or known chromosome abnormalities.  Samantha Rose stated that this is her second pregnancy.  The couple had an early miscarriage at 22 weeks previously.  She reported no complications or exposures in this pregnancy other than her thyroid medication.  After consideration of the options, Samantha Rose elected to proceed with first trimester screening and to decline CF and SMA testing.  An ultrasound was performed at the time of the visit.  The gestational age was consistent with  12 weeks.  Fetal anatomy could not  be assessed due to early gestational age.  Please refer to the ultrasound report for details of that study.  Samantha Rose was encouraged to call with questions or concerns.  We can be contacted at [phone].    Wilburt Finlay, MS, CGC  Dayane Hillenburg, [place] A, MD

## 2016-10-11 ENCOUNTER — Telehealth: Payer: Self-pay | Admitting: Obstetrics and Gynecology

## 2016-10-11 NOTE — Telephone Encounter (Signed)
Ms. Samantha Rose  elected to undergo First Trimester screening on 10/07/2016.  To review, first trimester screening, includes nuchal translucency ultrasound screen and/or first trimester maternal serum marker screening.  The nuchal translucency has approximately an 80% detection rate for Down syndrome and can be positive for other chromosome abnormalities as well as heart defects.  When combined with a maternal serum marker screening, the detection rate is up to 90% for Down syndrome and up to 97% for trisomy 13 and 18.     The results of the First Trimester Nuchal Translucency and Biochemical Screening were within normal range.  The risk for Down syndrome is now estimated to be 1 in 3,043.  The risk for Trisomy 13/18 is 1 in >10,000  Should more definitive information be desired, we would offer amniocentesis.  Because we do not yet know the effectiveness of combined first and second trimester screening, we do not recommend a maternal serum screen to assess the chance for chromosome conditions.  However, if screening for neural tube defects is desired, maternal serum screening for AFP only can be performed between 15 and [redacted] weeks gestation.     Wilburt Finlay, MS, CGC

## 2016-11-07 ENCOUNTER — Emergency Department
Admission: EM | Admit: 2016-11-07 | Discharge: 2016-11-07 | Disposition: A | Discharge status: Home / Self Care | Payer: Managed Care, Other (non HMO) | Attending: Emergency Medicine | Admitting: Emergency Medicine

## 2016-11-07 DIAGNOSIS — Z87891 Personal history of nicotine dependence: Secondary | ICD-10-CM | POA: Diagnosis not present

## 2016-11-07 DIAGNOSIS — N898 Other specified noninflammatory disorders of vagina: Secondary | ICD-10-CM | POA: Diagnosis not present

## 2016-11-07 DIAGNOSIS — Z3A17 17 weeks gestation of pregnancy: Secondary | ICD-10-CM | POA: Diagnosis not present

## 2016-11-07 DIAGNOSIS — Z79899 Other long term (current) drug therapy: Secondary | ICD-10-CM | POA: Insufficient documentation

## 2016-11-07 DIAGNOSIS — O26892 Other specified pregnancy related conditions, second trimester: Secondary | ICD-10-CM | POA: Diagnosis not present

## 2016-11-07 LAB — HCG, QUANTITATIVE, PREGNANCY: hCG, Beta Chain, Quant, S: 34718 m[IU]/mL — ABNORMAL HIGH (ref <5)

## 2016-11-07 LAB — CBC
HEMATOCRIT: 35.4 % (ref 35.0–47.0)
Hemoglobin: 12.4 g/dL (ref 12.0–16.0)
MCH: 29.4 pg (ref 26.0–34.0)
MCHC: 35 g/dL (ref 32.0–36.0)
MCV: 83.8 fL (ref 80.0–100.0)
PLATELETS: 173 10*3/uL (ref 150–440)
RBC: 4.23 MIL/uL (ref 3.80–5.20)
RDW: 14.6 % — AB (ref 11.5–14.5)
WBC: 9.7 10*3/uL (ref 3.6–11.0)

## 2016-11-07 LAB — BASIC METABOLIC PANEL
Anion gap: 6 (ref 5–15)
BUN: 9 mg/dL (ref 6–20)
CALCIUM: 9.1 mg/dL (ref 8.9–10.3)
CO2: 23 mmol/L (ref 22–32)
CREATININE: 0.36 mg/dL — AB (ref 0.44–1.00)
Chloride: 105 mmol/L (ref 101–111)
GFR calc non Af Amer: >60 mL/min (ref >60)
Glucose, Bld: 91 mg/dL (ref 65–99)
Potassium: 3.8 mmol/L (ref 3.5–5.1)
Sodium: 134 mmol/L — ABNORMAL LOW (ref 135–145)

## 2016-11-07 LAB — URINALYSIS COMPLETE WITH MICROSCOPIC (ARMC ONLY)
BILIRUBIN URINE: NEGATIVE
GLUCOSE, UA: 50 mg/dL — AB
Hgb urine dipstick: NEGATIVE
KETONES UR: NEGATIVE mg/dL
NITRITE: NEGATIVE
Protein, ur: NEGATIVE mg/dL
SPECIFIC GRAVITY, URINE: 1.017 (ref 1.005–1.030)
pH: 6 (ref 5.0–8.0)

## 2016-11-07 LAB — WET PREP, GENITAL
Clue Cells Wet Prep HPF POC: NONE SEEN
SPERM: NONE SEEN
Trich, Wet Prep: NONE SEEN
YEAST WET PREP: NONE SEEN

## 2016-11-07 LAB — CHLAMYDIA/NGC RT PCR (ARMC ONLY)

## 2016-11-07 LAB — ABO/RH: ABO/RH(D): O POS

## 2016-11-07 NOTE — ED Provider Notes (Signed)
[facility] Emergency Department Provider Note  Time seen: 7:31 PM  I have reviewed the triage vital signs and the nursing notes.   HISTORY  Chief Complaint Vaginal Discharge    HPI Samantha Rose is a 25 y.o. female G2 P0 A1 who presents to the emergency department approximately [redacted] weeks pregnant with vaginal spotting and discharge. According to the patient yesterday she wiped and noticed a mild amount of pink discharge. Today she states she wiped again and noted clear discharge. Denies any pain. Denies any fever, nausea, vomiting, diarrhea or dysuria. Patient states she had bleeding early on in this pregnancy but has not had any since.  No past medical history on file.  Patient Active Problem List   Diagnosis Date Noted  . First trimester screening 10/07/2016    Past Surgical History:  Procedure Laterality Date  . BREAST CYST EXCISION Right   . LAPAROSCOPY N/A 06/01/2016   Procedure: LAPAROSCOPY DIAGNOSTIC;  Surgeon: Benjaman Kindler, MD;  Location: [facility] ORS;  Service: Gynecology;  Laterality: N/A;    Prior to Admission medications   Medication Sig Start Date End Date Taking? Authorizing Provider  diphenhydrAMINE (BENADRYL) 2 % cream Apply topically 3 (three) times daily as needed for itching. Apply to the skin outside the vagina for burning sensation. Patient not taking: Reported on 10/07/2016 08/29/16   Schuyler Amor, MD  oxyCODONE-acetaminophen (PERCOCET/ROXICET) 5-325 MG tablet Take 1 tablet by mouth every 6 (six) hours as needed for moderate pain or severe pain. Patient not taking: Reported on 10/07/2016 06/01/16   Benjaman Kindler, MD  oxyCODONE-acetaminophen (ROXICET) 5-325 MG tablet Take 1 tablet by mouth every 6 (six) hours as needed. Patient not taking: Reported on 10/07/2016 05/31/16   Orbie Pyo, MD  Prenatal Vit-Fe Fumarate-FA (MULTIVITAMIN-PRENATAL) 27-0.8 MG TABS tablet Take 1 tablet by mouth daily at 12 noon.    Historical  Provider, MD    No Known Allergies  No family history on file.  Social History Social History  Substance Use Topics  . Smoking status: Former Smoker    Types: Cigarettes  . Smokeless tobacco: Never Used  . Alcohol use No    Review of Systems Constitutional: Negative for fever. Cardiovascular: Negative for chest pain. Respiratory: Negative for shortness of breath. Gastrointestinal: Negative for abdominal pain, vomiting and diarrhea. Genitourinary: Negative for dysuria. Mild-to-moderate vaginal discharge, clear in color, pink yesterday. Musculoskeletal: Negative for back pain. Neurological: Negative for headache 10-point ROS otherwise negative.  ____________________________________________   PHYSICAL EXAM:  VITAL SIGNS: ED Triage Vitals  Enc Vitals Group     BP 11/07/16 1843 132/69     Pulse Rate 11/07/16 1843 98     Resp 11/07/16 1843 18     Temp 11/07/16 1843 98 F (36.7 C)     Temp Source 11/07/16 1843 Oral     SpO2 11/07/16 1843 98 %     Weight 11/07/16 1844 155 lb (70.3 kg)     Height 11/07/16 1844 5\' 3"  (1.6 m)     Head Circumference --      Peak Flow --      Pain Score --      Pain Loc --      Pain Edu? --      Excl. in [place]? --     Constitutional: Alert and oriented. Well appearing and in no distress. Eyes: Normal exam ENT   Head: Normocephalic and atraumatic.   Mouth/Throat: Mucous membranes are moist. Cardiovascular: Normal rate, regular rhythm.  No murmur Respiratory: Normal respiratory effort without tachypnea nor retractions. Breath sounds are clear  Gastrointestinal: Soft and nontender. No distention.   Musculoskeletal: Nontender with normal range of motion in all extremities. Neurologic:  Normal speech and language. No gross focal neurologic deficits  Skin:  Skin is warm, dry and intact.  Psychiatric: Mood and affect are normal.  ____________________________________________    INITIAL IMPRESSION / ASSESSMENT AND PLAN / ED  COURSE  Pertinent labs & imaging results that were available during my care of the patient were reviewed by me and considered in my medical decision making (see chart for details).  The patient presents to the emergency department with clear vaginal discharge, pink discharge yesterday. Denies abdominal pain. Patient's urinalysis shows 6-30 white blood cells otherwise within normal limits, patient denies dysuria I will add on a urine culture. Overall patient appears well. I'll perform a bedside ultrasound to evaluate her pregnancy, pelvic examination, and check swabs.  Mild amount of vaginal discharge. No cervical motion or adnexal tenderness.  Bedside AND shows great fetal movement, normal appearing and not being amniotic fluid. Heart rate at 150 bpm. Wet prep is negative. Given the pelvic exam findings with a normal wet prep I suspect normal/non-infectious vaginal discharge. No signs of fluid leakage.  ____________________________________________   FINAL CLINICAL IMPRESSION(S) / ED DIAGNOSES  Vaginal discharge    Harvest Dark, MD 11/07/16 [phone]

## 2016-11-07 NOTE — Discharge Instructions (Signed)
You have been seen in the emergency department today for vaginal discharge. Your workup as shown normal results. Your ultrasound appears to show a normal pregnancy. Please follow-up with your OB/GYN in the next 1 week for recheck/reevaluation. Return to the emergency department for any abdominal pain, vaginal bleeding, or any other symptom personally concerning to yourself.

## 2016-11-07 NOTE — ED Triage Notes (Signed)
Pt states that she noted some bloody discharge yesterday and some mucous today, pt reports that she is approx [redacted] weeks pregnant, pt states that her last ob appt was nov 1st, pt denies pain, denies pain with urination

## 2016-11-08 ENCOUNTER — Telehealth: Payer: Self-pay | Admitting: Emergency Medicine

## 2016-11-08 NOTE — Telephone Encounter (Signed)
Called patient and informed her that gc/chlamydia testing was not done due to problem with specimen.  Advised to inform her doctor to see if she still needs this done.  She agrees.

## 2016-12-13 NOTE — L&D Delivery Note (Signed)
Operative Delivery Note At 8:17 AM  On 04/09/17 a viable female was delivered via low forcep Kielland rotation.  Presentation: vertex; Position: Left,, Occiput,, Transverse; Station: +3/3 Verbal consent: obtained from patient.  Risks and benefits discussed in detail.  Risks include, but are not limited to the risks of anesthesia, bleeding, infection, damage to maternal tissues, fetal cephalhematoma.  There is also the risk of inability to effect vaginal delivery of the head, or shoulder dystocia that cannot be resolved by established maneuvers, leading to the need for emergency cesarean section.  SEE DICTATED note for full DETAILS APGAR: 8, 9; weight  .   Placenta status: , .intact   Cord:delayed clamping 3 v  with the following complications: None.    Anesthesia:  Lidocaine, CLE Instruments: Kielland forceps Episiotomy: None Lacerations: 2nd degree Suture Repair: 2.0 3.0 vicryl Est. Blood Loss (mL): 250  Mom to postpartum.  Baby to Couplet care / Skin to Skin.  Samantha Rose 04/09/2017, 9:00 AM

## 2016-12-23 ENCOUNTER — Observation Stay
Admission: EM | Admit: 2016-12-23 | Discharge: 2016-12-23 | Disposition: A | Discharge status: Home / Self Care | Payer: Managed Care, Other (non HMO) | Attending: Obstetrics and Gynecology | Admitting: Obstetrics and Gynecology

## 2016-12-23 ENCOUNTER — Encounter: Payer: Self-pay | Admitting: *Deleted

## 2016-12-23 DIAGNOSIS — Z3A23 23 weeks gestation of pregnancy: Secondary | ICD-10-CM | POA: Diagnosis not present

## 2016-12-23 DIAGNOSIS — O26852 Spotting complicating pregnancy, second trimester: Principal | ICD-10-CM | POA: Insufficient documentation

## 2016-12-23 DIAGNOSIS — O99282 Endocrine, nutritional and metabolic diseases complicating pregnancy, second trimester: Secondary | ICD-10-CM | POA: Diagnosis not present

## 2016-12-23 DIAGNOSIS — E0590 Thyrotoxicosis, unspecified without thyrotoxic crisis or storm: Secondary | ICD-10-CM | POA: Diagnosis not present

## 2016-12-23 DIAGNOSIS — N939 Abnormal uterine and vaginal bleeding, unspecified: Secondary | ICD-10-CM | POA: Diagnosis present

## 2016-12-23 DIAGNOSIS — Z79899 Other long term (current) drug therapy: Secondary | ICD-10-CM | POA: Insufficient documentation

## 2016-12-23 HISTORY — DX: Thyrotoxicosis, unspecified without thyrotoxic crisis or storm: E05.90

## 2016-12-23 LAB — URINALYSIS, COMPLETE (UACMP) WITH MICROSCOPIC
Bilirubin Urine: NEGATIVE
Glucose, UA: 50 mg/dL — AB
Hgb urine dipstick: NEGATIVE
KETONES UR: NEGATIVE mg/dL
Nitrite: NEGATIVE
PH: 6 (ref 5.0–8.0)
Protein, ur: NEGATIVE mg/dL
Specific Gravity, Urine: 1.021 (ref 1.005–1.030)

## 2016-12-23 NOTE — OB Triage Note (Signed)
Patient given discharge instructions including preterm labor precautions and follow up information.  Patient verbalized understanding. Discharged in stable condition ambulatory, accompanied by mother.

## 2016-12-23 NOTE — Progress Notes (Signed)
Patient ID: Samantha Rose, female   DOB: 30-Mar-1991, 26 y.o.   MRN: [id number] [name] 05-May-1991 G2 P0 [redacted]w[redacted]d presents for vaginal spotting when wiping today . No cramping . No abd pain  noLOF , + vaginal spotting ,  Pregnancy complicated by Hyperthyroidism , currently on Methimazole and followed by Dr Gabriel Carina  O;BP 122/61 (BP Location: Left Arm)   Pulse 92   Temp 98.1 F (36.7 C) (Oral)   Resp 18   LMP 06/26/2016  ABD soft gravid  CX closed , long , no blood on glove NST reassurring for 23 weeks , no CTX  Labs: ua pending  A: vaginal spotting with no evidence for cx dilation or PTL   P:pt is reassured , RTC if more bleeding occurs. Pelvic rest 1 week . No heavy lifting

## 2016-12-23 NOTE — OB Triage Note (Signed)
Patient states she had bright red bleeding when she wiped after using the bathroom around 1300 today and again at 1730. Pt denies any LOF or pain. Pt states baby is moving well.

## 2016-12-23 NOTE — Discharge Summary (Signed)
  Boykin Nearing, MD  Obstetrics    [] Hide copied text [] Hover for attribution information Patient ID: Samantha Rose, female   DOB: 01-Oct-1991, 26 y.o.   MRN: [id number] [name] 20-Dec-1990 G2 P0 [redacted]w[redacted]d presents for vaginal spotting when wiping today . No cramping . No abd pain  noLOF , + vaginal spotting ,  Pregnancy complicated by Hyperthyroidism , currently on Methimazole and followed by Dr Gabriel Carina  O;BP 122/61 (BP Location: Left Arm)   Pulse 92   Temp 98.1 F (36.7 C) (Oral)   Resp 18   LMP 06/26/2016  ABD soft gravid  CX closed , long , no blood on glove NST reassurring for 23 weeks , no CTX  Labs: ua pending  A: vaginal spotting with no evidence for cx dilation or PTL   P:pt is reassured , RTC if more bleeding occurs. Pelvic rest 1 week . No heavy lifting     Electronically signed by Boykin Nearing, MD at 12/23/2016 8:58 PM

## 2016-12-24 IMAGING — NM NM THYROID IMAGING W/ UPTAKE MULTI (4&24 HR)
1 series · 3 of 3 positions shown · non-contrast
Comparison: None.

CLINICAL DATA: Evaluate multinodular goiter.

EXAM:
THYROID SCAN AND UPTAKE - 4 AND 24 HOURS
TECHNIQUE: Following oral administration of I 123 capsule, anterior planar
imaging was acquired at 24 hours. Thyroid uptake was calculated with
a thyroid probe at 4-6 hours and 24 hours.
RADIOPHARMACEUTICALS:  164.8 uCi I -123

[Series 1000: (id) thyroid scan · 2.40mm/px · 3 of 3 slices shown]
[im 1/3  full-range]
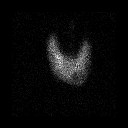
[im 2/3  full-range]
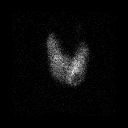
[im 3/3  full-range]
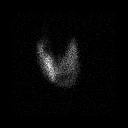

[3 of 3 positions shown; findings below may reference images not displayed]

FINDINGS: A cold defect is identified within the inferior pole of the left
lobe of thyroid gland. Relative uniform activity is noted within the
remaining portions of the gland..

6 hour I 123 uptake = 18.0% (normal 5-20%)

24 hour I 123 uptake = 29.8% (normal 10-30%)
IMPRESSION: The 6 hour and 24 hour radioactive iodine uptake are at the upper
limits of normal.

Cold defect is noted localizing to the inferior pole of the left
lobe of thyroid gland. Consider further evaluation with thyroid
sonogram.

## 2016-12-25 LAB — URINE CULTURE
CULTURE: NO GROWTH
SPECIAL REQUESTS: NORMAL

## 2017-02-04 ENCOUNTER — Observation Stay
Admission: EM | Admit: 2017-02-04 | Discharge: 2017-02-04 | Disposition: A | Discharge status: Home / Self Care | Payer: Managed Care, Other (non HMO) | Attending: Obstetrics & Gynecology | Admitting: Obstetrics & Gynecology

## 2017-02-04 ENCOUNTER — Encounter: Payer: Self-pay | Admitting: *Deleted

## 2017-02-04 DIAGNOSIS — Z9889 Other specified postprocedural states: Secondary | ICD-10-CM | POA: Diagnosis not present

## 2017-02-04 DIAGNOSIS — Z369 Encounter for antenatal screening, unspecified: Secondary | ICD-10-CM

## 2017-02-04 DIAGNOSIS — Z808 Family history of malignant neoplasm of other organs or systems: Secondary | ICD-10-CM | POA: Diagnosis not present

## 2017-02-04 DIAGNOSIS — Z8349 Family history of other endocrine, nutritional and metabolic diseases: Secondary | ICD-10-CM | POA: Diagnosis not present

## 2017-02-04 DIAGNOSIS — Z87891 Personal history of nicotine dependence: Secondary | ICD-10-CM | POA: Diagnosis not present

## 2017-02-04 DIAGNOSIS — Z803 Family history of malignant neoplasm of breast: Secondary | ICD-10-CM | POA: Insufficient documentation

## 2017-02-04 DIAGNOSIS — Z823 Family history of stroke: Secondary | ICD-10-CM | POA: Diagnosis not present

## 2017-02-04 DIAGNOSIS — Z833 Family history of diabetes mellitus: Secondary | ICD-10-CM | POA: Insufficient documentation

## 2017-02-04 DIAGNOSIS — Z3A29 29 weeks gestation of pregnancy: Secondary | ICD-10-CM | POA: Diagnosis not present

## 2017-02-04 DIAGNOSIS — O99283 Endocrine, nutritional and metabolic diseases complicating pregnancy, third trimester: Secondary | ICD-10-CM | POA: Insufficient documentation

## 2017-02-04 DIAGNOSIS — O42913 Preterm premature rupture of membranes, unspecified as to length of time between rupture and onset of labor, third trimester: Principal | ICD-10-CM | POA: Insufficient documentation

## 2017-02-04 NOTE — OB Triage Note (Signed)
Recvd pt from ED. C/o LOF that she noticed when she woke up in her panties. Feeling baby move ok and no intercourse in the past 24 hours. No bleeding.

## 2017-02-04 NOTE — Discharge Summary (Signed)
Samantha Rose is a 26 y.o. female. She is at [redacted]w[redacted]d gestation. Patient's last menstrual period was 06/26/2016. Estimated Date of Delivery: 04/17/17  Chief Complaint: leaking of fluid  S: Resting comfortably. no CTX, no VB.no LOF,  Active fetal movement.   Location: vagina Context: patient was asleep and woke to the sensation of fluid passing  Onset/timing/duration: sudden, once Quality: clear Severity: moderate amount Aggravating factors: nothing Alleviating factors: nothing Associated signs/symptoms: no fever, chills, contractions, dysuria   Maternal Medical History:   Past Medical History:  Diagnosis Date  . Hyperthyroidism     Past Surgical History:  Procedure Laterality Date  . BREAST CYST EXCISION Right   . LAPAROSCOPY N/A 06/01/2016   Procedure: LAPAROSCOPY DIAGNOSTIC;  Surgeon: Benjaman Kindler, MD;  Location: [facility] ORS;  Service: Gynecology;  Laterality: N/A;    No Known Allergies  Prior to Admission medications   Medication Sig Start Date End Date Taking? Authorizing Provider  ferrous sulfate 325 (65 FE) MG tablet Take 325 mg by mouth daily with breakfast.   Yes Historical Provider, MD  Prenatal Vit-Fe Fumarate-FA (MULTIVITAMIN-PRENATAL) 27-0.8 MG TABS tablet Take 1 tablet by mouth daily at 12 noon.   Yes Historical Provider, MD  diphenhydrAMINE (BENADRYL) 2 % cream Apply topically 3 (three) times daily as needed for itching. Apply to the skin outside the vagina for burning sensation. Patient not taking: Reported on 10/07/2016 08/29/16   Schuyler Amor, MD  oxyCODONE-acetaminophen (PERCOCET/ROXICET) 5-325 MG tablet Take 1 tablet by mouth every 6 (six) hours as needed for moderate pain or severe pain. Patient not taking: Reported on 10/07/2016 06/01/16   Benjaman Kindler, MD  oxyCODONE-acetaminophen (ROXICET) 5-325 MG tablet Take 1 tablet by mouth every 6 (six) hours as needed. Patient not taking: Reported on 02/04/2017 05/31/16   Orbie Pyo, MD      Prenatal care site: [place]    Social History: She  reports that she has quit smoking. Her smoking use included Cigarettes. She has never used smokeless tobacco. She reports that she does not drink alcohol or use drugs.  Family History: family history includes Breast cancer in her maternal grandmother; Diabetes mellitus in her mother and paternal grandmother; Hypertension in her father, maternal grandfather, mother, and paternal grandfather; Stroke in her father; Thyroid cancer in her mother; Thyroid disease in her mother.  Review of Systems: A full review of systems was performed and negative except as noted in the HPI.     O:  BP 125/71 (BP Location: Right Arm)   Pulse 84   Temp 98.3 F (36.8 C) (Oral)   Resp 16   LMP 06/26/2016  No results found for this or any previous visit (from the past 48 hour(s)).   Constitutional: NAD, AAOx3  HE/ENT: extraocular movements grossly intact, moist mucous membranes CV: RRR PULM: nl respiratory effort, CTABL     Abd: gravid, non-tender, non-distended, soft      Ext: Non-tender, Nonedmeatous   Psych: mood appropriate, speech normal Pelvic: nitrazine negative, pooling negative  FHT: 135 mod + accels no decels  TOCO: quiet   A/P:  25yo G2P0 @ 29.5 with rule out rupture of membranes   Labor: not present.   Fetal Wellbeing: Reassuring Cat 1 tracing.  Not ruptured, likely release of some urine  D/c home stable, precautions reviewed, follow-up as scheduled.   ----- Larey Days, MD Attending Obstetrician and Gynecologist [facility], Department of [facility]

## 2017-02-04 NOTE — Discharge Instructions (Signed)
Come back if:  Big gush of fluids Heavy vaginal bleeding Temp over 100.4 Decreased fetal movement Contractions every 3-5 min lasting at least one hour  Get plenty of rest and stay well hydrated!

## 2017-02-04 NOTE — Procedures (Signed)
NST  Patient's last menstrual period was 06/26/2016. Estimated Date of Delivery: 04/17/17  Baseline: 135 Variability: moderate Accelerations present x >2 Decelerations absent Time 41mins  Interpretation: reactive NST, category 1 tracing  ----- Larey Days, MD Attending Obstetrician and Gynecologist [facility], Department of [facility]

## 2017-02-20 ENCOUNTER — Inpatient Hospital Stay
Admission: EM | Admit: 2017-02-20 | Discharge: 2017-02-20 | Disposition: A | Discharge status: Home / Self Care | Payer: Managed Care, Other (non HMO) | Attending: Obstetrics & Gynecology | Admitting: Obstetrics & Gynecology

## 2017-02-20 DIAGNOSIS — O4703 False labor before 37 completed weeks of gestation, third trimester: Secondary | ICD-10-CM | POA: Insufficient documentation

## 2017-02-20 DIAGNOSIS — Z3A32 32 weeks gestation of pregnancy: Secondary | ICD-10-CM | POA: Insufficient documentation

## 2017-02-20 LAB — AMYLASE: Amylase: 70 U/L (ref 28–100)

## 2017-02-20 LAB — URINALYSIS, COMPLETE (UACMP) WITH MICROSCOPIC
Bilirubin Urine: NEGATIVE
Glucose, UA: NEGATIVE mg/dL
Hgb urine dipstick: NEGATIVE
KETONES UR: NEGATIVE mg/dL
Nitrite: NEGATIVE
PH: 7 (ref 5.0–8.0)
PROTEIN: NEGATIVE mg/dL
Specific Gravity, Urine: 1.015 (ref 1.005–1.030)

## 2017-02-20 LAB — LIPASE, BLOOD: Lipase: 21 U/L (ref 11–51)

## 2017-02-20 LAB — CHLAMYDIA/NGC RT PCR (ARMC ONLY)
Chlamydia Tr: NOT DETECTED
N gonorrhoeae: NOT DETECTED

## 2017-02-20 LAB — CBC WITH DIFFERENTIAL/PLATELET
BLASTS: 0 %
Band Neutrophils: 0 %
Basophils Absolute: 0 10*3/uL (ref 0–0.1)
Basophils Relative: 0 %
Eosinophils Absolute: 0.1 10*3/uL (ref 0–0.7)
Eosinophils Relative: 1 %
HCT: 30.8 % — ABNORMAL LOW (ref 35.0–47.0)
HEMOGLOBIN: 11.1 g/dL — AB (ref 12.0–16.0)
Lymphocytes Relative: 15 %
Lymphs Abs: 1.2 10*3/uL (ref 1.0–3.6)
MCH: 31.5 pg (ref 26.0–34.0)
MCHC: 36 g/dL (ref 32.0–36.0)
MCV: 87.7 fL (ref 80.0–100.0)
MYELOCYTES: 2 %
Metamyelocytes Relative: 0 %
Monocytes Absolute: 0.4 10*3/uL (ref 0.2–0.9)
Monocytes Relative: 5 %
NEUTROS PCT: 77 %
NRBC: 0 /100{WBCs}
Neutro Abs: 6.6 10*3/uL — ABNORMAL HIGH (ref 1.4–6.5)
Other: 0 %
PROMYELOCYTES ABS: 0 %
Platelets: 136 10*3/uL — ABNORMAL LOW (ref 150–440)
RBC: 3.51 MIL/uL — AB (ref 3.80–5.20)
RDW: 15.3 % — ABNORMAL HIGH (ref 11.5–14.5)
WBC: 8.3 10*3/uL (ref 3.6–11.0)

## 2017-02-20 LAB — URINE DRUG SCREEN, QUALITATIVE (ARMC ONLY)
Amphetamines, Ur Screen: NOT DETECTED
BARBITURATES, UR SCREEN: NOT DETECTED
Benzodiazepine, Ur Scrn: NOT DETECTED
CANNABINOID 50 NG, UR ~~LOC~~: NOT DETECTED
Cocaine Metabolite,Ur ~~LOC~~: NOT DETECTED
MDMA (Ecstasy)Ur Screen: NOT DETECTED
Methadone Scn, Ur: NOT DETECTED
Opiate, Ur Screen: NOT DETECTED
Phencyclidine (PCP) Ur S: NOT DETECTED
TRICYCLIC, UR SCREEN: NOT DETECTED

## 2017-02-20 LAB — WET PREP, GENITAL
SPERM: NONE SEEN
TRICH WET PREP: NONE SEEN
Yeast Wet Prep HPF POC: NONE SEEN

## 2017-02-20 LAB — COMPREHENSIVE METABOLIC PANEL
ALK PHOS: 66 U/L (ref 38–126)
ALT: 16 U/L (ref 14–54)
AST: 20 U/L (ref 15–41)
Albumin: 3.1 g/dL — ABNORMAL LOW (ref 3.5–5.0)
Anion gap: 7 (ref 5–15)
BUN: 6 mg/dL (ref 6–20)
CALCIUM: 8.8 mg/dL — AB (ref 8.9–10.3)
CO2: 21 mmol/L — AB (ref 22–32)
CREATININE: 0.38 mg/dL — AB (ref 0.44–1.00)
Chloride: 107 mmol/L (ref 101–111)
Glucose, Bld: 72 mg/dL (ref 65–99)
Potassium: 3.6 mmol/L (ref 3.5–5.1)
SODIUM: 135 mmol/L (ref 135–145)
Total Bilirubin: 0.5 mg/dL (ref 0.3–1.2)
Total Protein: 6.3 g/dL — ABNORMAL LOW (ref 6.5–8.1)

## 2017-02-20 MED ORDER — METRONIDAZOLE 500 MG PO TABS
500.0000 mg | ORAL_TABLET | Freq: Two times a day (BID) | ORAL | Status: DC
  Administered 2017-02-20: 500 mg via ORAL
  Filled 2017-02-20: qty 1

## 2017-02-20 MED ORDER — NITROFURANTOIN MONOHYD MACRO 100 MG PO CAPS
100.0000 mg | ORAL_CAPSULE | Freq: Two times a day (BID) | ORAL | Status: DC
  Administered 2017-02-20: 100 mg via ORAL
  Filled 2017-02-20: qty 1

## 2017-02-20 MED ORDER — LACTATED RINGERS IV BOLUS (SEPSIS)
1000.0000 mL | Freq: Once | INTRAVENOUS | Status: AC
  Administered 2017-02-20: 1000 mL via INTRAVENOUS

## 2017-02-20 MED ORDER — METRONIDAZOLE 500 MG PO TABS: 2000.0000 mg | ORAL_TABLET | Freq: Once | ORAL | Status: DC

## 2017-02-20 NOTE — Progress Notes (Signed)
Talked to pt about visit at [facility] yesterday for UTI and + trich, states she has not started antibiotics and was told if she didn't feel better today to come back in. Adv pt unlikely to see improvement without taking ordered medications. Pt also confused about positive trich result as she states she has not had sex since October and had not had a positive screen during the pregnancy. Adv her would discuss with Alden Server, CNM

## 2017-02-20 NOTE — OB Triage Note (Signed)
Samantha Rose here with right sided lower abdominal pain that started Friday and has not improved, pt reports the pain comes and goes, denies ctx, reports positive fetal movement, denies bleeding, LOF.

## 2017-02-20 NOTE — OB Triage Provider Note (Signed)
History     CSN: [id number]  Arrival date and time: 02/20/17 1028   None     No chief complaint on file.  HPI  Samantha Rose is a 26 yo G2P0 at 32 weeks presenting today with right lower quadrant pain.  She was seen at [facility] yesterday and was diagnosed with trichomonas and a urinary tract infection.  She had not picked up her prescriptions and came in today with intermittent sharp right lower quadrant pain.  She states they were told to come back if the pain continued. She endorses good fetal movement.  She denies fevers, chills, dysuria, urgency, frequency, n/v/c/d, abnormal discharge, vaginal bleeding.  She does state she still has "leaking with contractions."  She denies recent intercourse since the beginning of October, and she is concerned for the positive trichomonas, since it has not shown up previously. She states when she went to [facility], they had her use the bathroom in a general public restroom and they did not label her urine, so there is a possibility for error.    Wet prep at [facility]: +trichomonas, no yeast, no clue cells UA: suspicious for a UTI CBC: WNL Renal US: --Nonobstructing bilateral nephrolithiasis. --Urinary bladder was seen the previous, can reflect infectious process versus blood clots. Clinical correlation is recommended.  OB History    Gravida Para Term Preterm AB Living   2 0     1 0   SAB TAB Ectopic Multiple Live Births   1              Past Medical History:  Diagnosis Date  . Hyperthyroidism     Past Surgical History:  Procedure Laterality Date  . BREAST CYST EXCISION Right   . LAPAROSCOPY N/A 06/01/2016   Procedure: LAPAROSCOPY DIAGNOSTIC;  Surgeon: Benjaman Kindler, MD;  Location: [facility] ORS;  Service: Gynecology;  Laterality: N/A;    No family history on file.  Social History  Substance Use Topics  . Smoking status: Former Smoker    Types: Cigarettes  . Smokeless tobacco: Never Used  . Alcohol use No    Allergies: No Known  Allergies  Prescriptions Prior to Admission  Medication Sig Dispense Refill Last Dose  . diphenhydrAMINE (BENADRYL) 2 % cream Apply topically 3 (three) times daily as needed for itching. Apply to the skin outside the vagina for burning sensation. (Patient not taking: Reported on 10/07/2016) 30 g 0 Not Taking at Unknown time  . ferrous sulfate 325 (65 FE) MG tablet Take 325 mg by mouth daily with breakfast.     . oxyCODONE-acetaminophen (PERCOCET/ROXICET) 5-325 MG tablet Take 1 tablet by mouth every 6 (six) hours as needed for moderate pain or severe pain. (Patient not taking: Reported on 10/07/2016) 6 tablet 0 Not Taking at Unknown time  . oxyCODONE-acetaminophen (ROXICET) 5-325 MG tablet Take 1 tablet by mouth every 6 (six) hours as needed. (Patient not taking: Reported on 02/04/2017) 10 tablet 0 Not Taking at Unknown time  . Prenatal Vit-Fe Fumarate-FA (MULTIVITAMIN-PRENATAL) 27-0.8 MG TABS tablet Take 1 tablet by mouth daily at 12 noon.   02/03/2017 at Unknown time    Review of Systems  Constitutional: Negative.   HENT: Negative.   Respiratory: Negative.   Gastrointestinal: Positive for abdominal pain. Negative for constipation, diarrhea and nausea.       Right lower quadrant - intermittent sharp pain   Genitourinary: Negative for dysuria, vaginal bleeding and vaginal discharge.  Neurological: Negative.   Psychiatric/Behavioral: Negative.    Physical Exam  Blood pressure 123/68, pulse 95, temperature 97.8 F (36.6 C), temperature source Oral, resp. rate 18, last menstrual period 06/26/2016.  Physical Exam  Constitutional: She appears well-developed and well-nourished.  Cardiovascular: Normal rate and regular rhythm.   Respiratory: Effort normal and breath sounds normal.  GI: Soft. Bowel sounds are normal.  Right lower quadrant tenderness Active fetal movement  Genitourinary:  Genitourinary Comments: Uterus: gravid, non-tender Sterile speculum exam: thick white, yellow discharge  noted in vault, mild erythema SVE: 1.5cm/30%/-2/vtx/anterior position    Fetal monitoring: Baseline: 135 bpm / moderate variability/ +accels/ occasional small variable deceleration, but overall reassuring for gestational age Toco: occasional UC with irritability    Procedures  Results for orders placed or performed during the hospital encounter of 02/20/17 (from the past 24 hour(s))  Chlamydia/NGC rt PCR ([place] only)     Status: None   Collection Time: 02/20/17 12:18 PM  Result Value Ref Range   Specimen source GC/Chlam ENDOCERVICAL    Chlamydia Tr NOT DETECTED NOT DETECTED   N gonorrhoeae NOT DETECTED NOT DETECTED  Wet prep, genital     Status: Abnormal   Collection Time: 02/20/17 12:18 PM  Result Value Ref Range   Yeast Wet Prep HPF POC NONE SEEN NONE SEEN   Trich, Wet Prep NONE SEEN NONE SEEN   Clue Cells Wet Prep HPF POC PRESENT (A) NONE SEEN   WBC, Wet Prep HPF POC MODERATE (A) NONE SEEN   Sperm NONE SEEN   Urine Drug Screen, Qualitative ([facility] only)     Status: None   Collection Time: 02/20/17 12:39 PM  Result Value Ref Range   Tricyclic, Ur Screen NONE DETECTED NONE DETECTED   Amphetamines, Ur Screen NONE DETECTED NONE DETECTED   MDMA (Ecstasy)Ur Screen NONE DETECTED NONE DETECTED   Cocaine Metabolite,Ur [place] NONE DETECTED NONE DETECTED   Opiate, Ur Screen NONE DETECTED NONE DETECTED   Phencyclidine (PCP) Ur S NONE DETECTED NONE DETECTED   Cannabinoid 50 Ng, Ur [place] NONE DETECTED NONE DETECTED   Barbiturates, Ur Screen NONE DETECTED NONE DETECTED   Benzodiazepine, Ur Scrn NONE DETECTED NONE DETECTED   Methadone Scn, Ur NONE DETECTED NONE DETECTED  Urinalysis, Complete w Microscopic     Status: Abnormal   Collection Time: 02/20/17 12:39 PM  Result Value Ref Range   Color, Urine YELLOW (A) YELLOW   APPearance HAZY (A) CLEAR   Specific Gravity, Urine 1.015 1.005 - 1.030   pH 7.0 5.0 - 8.0   Glucose, UA NEGATIVE NEGATIVE mg/dL   Hgb urine dipstick NEGATIVE NEGATIVE    Bilirubin Urine NEGATIVE NEGATIVE   Ketones, ur NEGATIVE NEGATIVE mg/dL   Protein, ur NEGATIVE NEGATIVE mg/dL   Nitrite NEGATIVE NEGATIVE   Leukocytes, UA LARGE (A) NEGATIVE   RBC / HPF 6-30 0 - 5 RBC/hpf   WBC, UA 6-30 0 - 5 WBC/hpf   Bacteria, UA MANY (A) NONE SEEN   Squamous Epithelial / LPF 0-5 (A) NONE SEEN   Mucous PRESENT   CBC with Differential/Platelet     Status: Abnormal   Collection Time: 02/20/17  1:30 PM  Result Value Ref Range   WBC 8.3 3.6 - 11.0 K/uL   RBC 3.51 (L) 3.80 - 5.20 MIL/uL   Hemoglobin 11.1 (L) 12.0 - 16.0 g/dL   HCT 30.8 (L) 35.0 - 47.0 %   MCV 87.7 80.0 - 100.0 fL   MCH 31.5 26.0 - 34.0 pg   MCHC 36.0 32.0 - 36.0 g/dL   RDW 15.3 (H) 11.5 -  14.5 %   Platelets 136 (L) 150 - 440 K/uL   Neutrophils Relative % 77 %   Lymphocytes Relative 15 %   Monocytes Relative 5 %   Eosinophils Relative 1 %   Basophils Relative 0 %   Band Neutrophils 0 %   Metamyelocytes Relative 0 %   Myelocytes 2 %   Promyelocytes Absolute 0 %   Blasts 0 %   nRBC 0 0 /100 WBC   Other 0 %   Neutro Abs 6.6 (H) 1.4 - 6.5 K/uL   Lymphs Abs 1.2 1.0 - 3.6 K/uL   Monocytes Absolute 0.4 0.2 - 0.9 K/uL   Eosinophils Absolute 0.1 0 - 0.7 K/uL   Basophils Absolute 0.0 0 - 0.1 K/uL   Smear Review MORPHOLOGY UNREMARKABLE   Comprehensive metabolic panel     Status: Abnormal   Collection Time: 02/20/17  1:30 PM  Result Value Ref Range   Sodium 135 135 - 145 mmol/L   Potassium 3.6 3.5 - 5.1 mmol/L   Chloride 107 101 - 111 mmol/L   CO2 21 (L) 22 - 32 mmol/L   Glucose, Bld 72 65 - 99 mg/dL   BUN 6 6 - 20 mg/dL   Creatinine, Ser 0.38 (L) 0.44 - 1.00 mg/dL   Calcium 8.8 (L) 8.9 - 10.3 mg/dL   Total Protein 6.3 (L) 6.5 - 8.1 g/dL   Albumin 3.1 (L) 3.5 - 5.0 g/dL   AST 20 15 - 41 U/L   ALT 16 14 - 54 U/L   Alkaline Phosphatase 66 38 - 126 U/L   Total Bilirubin 0.5 0.3 - 1.2 mg/dL   GFR calc non Af Amer >60 >60 mL/min   GFR calc Af Amer >60 >60 mL/min   Anion gap 7 5 - 15  Amylase      Status: None   Collection Time: 02/20/17  1:30 PM  Result Value Ref Range   Amylase 70 28 - 100 U/L  Lipase, blood     Status: None   Collection Time: 02/20/17  1:30 PM  Result Value Ref Range   Lipase 21 11 - 51 U/L    Assessment and Plan  1. IUP at 32 weeks with RLQ pain and threatened preterm labor     - Insert PIV     - LR 1 liter bolus x1    - GC/CT: negative    - Repeated wet prep: + clue cells, negative yeast, NEGATIVE trichomonas    - UA/UC    - CBC, CMP, Lipase, amylase - WNL  Amelda Hapke C 02/20/2017, 12:43 PM   Reassessment at 1500: - No cervical change in 3 hours despite irritability  - Patient states she is no longer in pain  - Labs reviewed with patient and mother and Rx sent electronically to [place] via [facility] - Discharge home    - Preterm labor and warning s/s reviewed     - Strict FKC's daily     - Note for work given to be out of work until she can be seen this weekn    - F/u at [facility] on Tuesday or Wednesday   Dr. Leonides Schanz updated, aware, and agrees with plan of care  Lars Pinks, CNM

## 2017-02-22 ENCOUNTER — Encounter: Payer: Self-pay | Admitting: *Deleted

## 2017-02-22 ENCOUNTER — Observation Stay
Admission: EM | Admit: 2017-02-22 | Discharge: 2017-02-22 | Disposition: A | Discharge status: Home / Self Care | Payer: Managed Care, Other (non HMO) | Attending: Obstetrics & Gynecology | Admitting: Obstetrics & Gynecology

## 2017-02-22 ENCOUNTER — Encounter: Payer: Self-pay | Admitting: Obstetrics and Gynecology

## 2017-02-22 DIAGNOSIS — Z3A32 32 weeks gestation of pregnancy: Secondary | ICD-10-CM | POA: Diagnosis not present

## 2017-02-22 DIAGNOSIS — O4703 False labor before 37 completed weeks of gestation, third trimester: Principal | ICD-10-CM | POA: Insufficient documentation

## 2017-02-22 LAB — URINE CULTURE

## 2017-02-22 MED ORDER — BETAMETHASONE SOD PHOS & ACET 6 (3-3) MG/ML IJ SUSP
12.0000 mg | INTRAMUSCULAR | Status: DC
  Administered 2017-02-22: 12 mg via INTRAMUSCULAR
  Filled 2017-02-22: qty 1

## 2017-02-22 NOTE — OB Triage Note (Signed)
Here for NST and BZM shot

## 2017-02-22 NOTE — Progress Notes (Signed)
   Triage visit for NST   Samantha Rose is a 26 y.o. G2P0010. She is at [redacted]w[redacted]d gestation. She presents for monitoring and BMZ because of advanced cervical dilation (3cm in office today) remote from term in primiparous patient.  S: Resting comfortably. Not feeling CTX though present on monitor, no VB. Active fetal movement.  O:  LMP 06/26/2016  No results found for this or any previous visit (from the past 48 hour(s)).   Gen: NAD, AAOx3      Abd: FNTTP      Ext: Non-tender, Nonedmeatous    NST  Baseline: 140 Variability: moderate Accelerations present x >2 Decelerations absent Time 35mins  Interpretation: reactive NST, category 1 tracing  A/P:  26 y.o. G2P0010 [redacted]w[redacted]d with advanced cervical dilation at 3cm and preterm contractions without further cervical change from earlier today.    Reactive NST, with moderate variability and accelerations, no decels  BMZ #1 given  Fetal Wellbeing: Reassuring  D/c home stable, precautions reviewed, follow-up as scheduled.     ----- Benjaman Kindler, MD MPH Attending Obstetrician and Gynecologist [facility], Department of [facility]

## 2017-02-23 ENCOUNTER — Inpatient Hospital Stay
Admission: EM | Admit: 2017-02-23 | Discharge: 2017-02-23 | Disposition: A | Discharge status: Home / Self Care | Payer: Managed Care, Other (non HMO) | Attending: Obstetrics and Gynecology | Admitting: Obstetrics and Gynecology

## 2017-02-23 DIAGNOSIS — Z3A32 32 weeks gestation of pregnancy: Secondary | ICD-10-CM | POA: Insufficient documentation

## 2017-02-23 DIAGNOSIS — O4703 False labor before 37 completed weeks of gestation, third trimester: Secondary | ICD-10-CM | POA: Diagnosis not present

## 2017-02-23 MED ORDER — BETAMETHASONE SOD PHOS & ACET 6 (3-3) MG/ML IJ SUSP
12.0000 mg | Freq: Once | INTRAMUSCULAR | Status: AC
  Administered 2017-02-23: 12 mg via INTRAMUSCULAR

## 2017-02-25 ENCOUNTER — Observation Stay
Admission: EM | Admit: 2017-02-25 | Discharge: 2017-02-25 | Disposition: A | Discharge status: Home / Self Care | Payer: Managed Care, Other (non HMO) | Attending: Obstetrics & Gynecology | Admitting: Obstetrics & Gynecology

## 2017-02-25 DIAGNOSIS — Z87891 Personal history of nicotine dependence: Secondary | ICD-10-CM | POA: Insufficient documentation

## 2017-02-25 DIAGNOSIS — Z3490 Encounter for supervision of normal pregnancy, unspecified, unspecified trimester: Secondary | ICD-10-CM

## 2017-02-25 DIAGNOSIS — O24419 Gestational diabetes mellitus in pregnancy, unspecified control: Secondary | ICD-10-CM | POA: Insufficient documentation

## 2017-02-25 DIAGNOSIS — O4703 False labor before 37 completed weeks of gestation, third trimester: Secondary | ICD-10-CM | POA: Diagnosis not present

## 2017-02-25 DIAGNOSIS — E0580 Other thyrotoxicosis without thyrotoxic crisis or storm: Secondary | ICD-10-CM | POA: Diagnosis not present

## 2017-02-25 DIAGNOSIS — O99283 Endocrine, nutritional and metabolic diseases complicating pregnancy, third trimester: Secondary | ICD-10-CM | POA: Diagnosis not present

## 2017-02-25 DIAGNOSIS — Z3A32 32 weeks gestation of pregnancy: Secondary | ICD-10-CM | POA: Insufficient documentation

## 2017-02-25 MED ORDER — TERBUTALINE SULFATE 1 MG/ML IJ SOLN
0.2500 mg | Freq: Once | INTRAMUSCULAR | Status: AC
  Administered 2017-02-25: 0.25 mg via SUBCUTANEOUS

## 2017-02-25 MED ORDER — TERBUTALINE SULFATE 1 MG/ML IJ SOLN
INTRAMUSCULAR | Status: AC
  Administered 2017-02-25: 0.25 mg via SUBCUTANEOUS
  Filled 2017-02-25: qty 1

## 2017-02-25 NOTE — OB Triage Note (Signed)
Ronnald Ramp, CNM aware of patient arrival & complaints of abd cramping & lower abd pain. Will plan to check cervix, PO hydrate and monitor FHR & uterine contractions.

## 2017-02-25 NOTE — Progress Notes (Signed)
Patient ID: Samantha Rose, female   DOB: 1991-08-08, 26 y.o.   MRN: [id number] Pt has not had a UC in 1 hour so will dc. Pt had workup on Tues for neg urine, +clue cells on Wet prep. Cx not rechecked as UC's stopped ASAP with Terb 1st dose. Will dc home. FU next week at [facility]

## 2017-02-25 NOTE — H&P (Signed)
Samantha BUCIO is a 26 y.o. female presenting for "TH PTL" with UC's today. Pt was seen 2 days ago and cx was 3 cms and pt received 2 doses of BMZ. Today she started having UC's 8 on 1-10 scale and came in for evaluation. LMP was 06/26/16 & EDD is 04/17/17 and attending [facility]. PNC included the risk of PTL and pt declined 17 hp. Pt also has hyper thyroid issue and was on Methimazole 5 mg po qd. Pt is seeing Dr Samantha Rose at [facility] OB History    Gravida Para Term Preterm AB Living   2 0 0 0 1 0   SAB TAB Ectopic Multiple Live Births   1 0 0 0 0     Past Medical History:  Diagnosis Date  . Hyperthyroidism    Past Surgical History:  Procedure Laterality Date  . BREAST CYST EXCISION Right   . LAPAROSCOPY N/A 06/01/2016   Procedure: LAPAROSCOPY DIAGNOSTIC;  Surgeon: Benjaman Kindler, MD;  Location: [facility] ORS;  Service: Gynecology;  Laterality: N/A;   Family History: family history is not on file. Social History:  reports that she has quit smoking. Her smoking use included Cigarettes. She has never used smokeless tobacco. She reports that she does not drink alcohol or use drugs.     Maternal Diabetes: 73 Genetic Screening:AFP: 1 in 10,000 OSB Maternal Ultrasounds/Referrals:Dr Solum for hyperthyroid, NOrmal Anatomy Fetal Ultrasounds or other Referrals: none Maternal Substance Abuse: neg Significant Maternal Medications: PNV, Methmizole 5 mg po  Significant Maternal Lab Results:  GBS unknown Other Comments:   Review of Systems  Constitutional: Negative.   HENT: Negative.   Eyes: Negative.   Respiratory: Negative.   Cardiovascular: Negative.   Gastrointestinal: Negative.   Genitourinary: Negative.   Musculoskeletal: Negative.   Skin: Negative.   Neurological: Negative.   Endo/Heme/Allergies: Negative.   Psychiatric/Behavioral: Negative.    History Dilation: 3 Effacement (%): 50 Station: -3 Exam by:: McSween, RN Blood pressure 123/71, pulse 86, temperature 98.1 F (36.7 C),  temperature source Oral, resp. rate 17, last menstrual period 06/26/2016. Exam Physical Exam  Gen: 26 yo black female in NAD. HEENT: eyes non-icteric, Heart: S1S2, RRR, No M/R?G Lungs: CTA bilat, no W/R?R. Abd: Gravid Cx: 3cms/long/post per report from 2 days ago Prenatal labs: ABO, Rh: --/--/O POS (11/26 1934) Antibody: NEG (06/20 1225) Rubella:  Immune RPR:   Neg HBsAg:   Neg HIV:   Neg GBS:   Unknown Varicella immune Assessment/Plan: A: 1. Th PTL at 32 weeks 2. Pt has had 2 doses of BMZ on 02/23/17 3. Cx change on Wed but, none since P: Continue to monitor uC/FHT's 2. Give Terbutaline 0.25 mg SQ if needed up to 3 doses. 3. GBS unknown. 4. Will reassess q 1-2 hours   Samantha Rose 02/25/2017, 9:36 PM

## 2017-02-25 NOTE — Discharge Instructions (Signed)
PTL precautions given, all questions answered.

## 2017-03-23 LAB — OB RESULTS CONSOLE GBS: GBS: POSITIVE

## 2017-03-23 LAB — OB RESULTS CONSOLE RPR: RPR: NONREACTIVE

## 2017-03-23 LAB — OB RESULTS CONSOLE GC/CHLAMYDIA
CHLAMYDIA, DNA PROBE: NEGATIVE
Gonorrhea: NEGATIVE

## 2017-03-26 ENCOUNTER — Observation Stay
Admission: EM | Admit: 2017-03-26 | Discharge: 2017-03-26 | Disposition: A | Discharge status: Home / Self Care | Payer: Managed Care, Other (non HMO) | Attending: Obstetrics & Gynecology | Admitting: Obstetrics & Gynecology

## 2017-03-26 DIAGNOSIS — O479 False labor, unspecified: Principal | ICD-10-CM | POA: Insufficient documentation

## 2017-03-26 DIAGNOSIS — Z3490 Encounter for supervision of normal pregnancy, unspecified, unspecified trimester: Secondary | ICD-10-CM

## 2017-03-26 DIAGNOSIS — Z369 Encounter for antenatal screening, unspecified: Secondary | ICD-10-CM

## 2017-03-26 NOTE — OB Triage Note (Signed)
Patient came in c/o of irregular contractions that began this morning. Patient reported that she does not know how far apart they are but that the contractions are irregular. Rating pain 8 out of 10 with contractions. Reports positive fetal movement. Denies vaginal bleeding. Denies vaginal discharge.

## 2017-03-26 NOTE — Discharge Summary (Signed)
Patient presented for evaluation of labor.  Patient had cervical exam by RN and this was reported to me. I reviewed her vital signs and fetal tracing, both of which were reassuring.  Patient was discharged as she was not laboring.  NST interpretation: Reactive.  Larey Days, MD Attending Obstetrician and Gynecologist [facility]

## 2017-03-26 NOTE — Progress Notes (Signed)
Verbal order via phone from Dr. Leonides Schanz to discharge patient since patient made no cervical change. Fetal strip reactive, category I.

## 2017-04-08 ENCOUNTER — Inpatient Hospital Stay
Admission: EM | Admit: 2017-04-08 | Discharge: 2017-04-11 | DRG: 775 | Disposition: A | Discharge status: Home / Self Care | Payer: 59 | Attending: Obstetrics and Gynecology | Admitting: Obstetrics and Gynecology

## 2017-04-08 DIAGNOSIS — O99284 Endocrine, nutritional and metabolic diseases complicating childbirth: Secondary | ICD-10-CM | POA: Diagnosis present

## 2017-04-08 DIAGNOSIS — E0590 Thyrotoxicosis, unspecified without thyrotoxic crisis or storm: Secondary | ICD-10-CM | POA: Diagnosis present

## 2017-04-08 DIAGNOSIS — O4292 Full-term premature rupture of membranes, unspecified as to length of time between rupture and onset of labor: Secondary | ICD-10-CM | POA: Diagnosis present

## 2017-04-08 DIAGNOSIS — Z87891 Personal history of nicotine dependence: Secondary | ICD-10-CM | POA: Diagnosis not present

## 2017-04-08 DIAGNOSIS — Z3A38 38 weeks gestation of pregnancy: Secondary | ICD-10-CM

## 2017-04-08 DIAGNOSIS — Z3493 Encounter for supervision of normal pregnancy, unspecified, third trimester: Secondary | ICD-10-CM | POA: Diagnosis present

## 2017-04-08 DIAGNOSIS — O99824 Streptococcus B carrier state complicating childbirth: Secondary | ICD-10-CM | POA: Diagnosis present

## 2017-04-08 DIAGNOSIS — Z369 Encounter for antenatal screening, unspecified: Secondary | ICD-10-CM

## 2017-04-08 DIAGNOSIS — O4290 Premature rupture of membranes, unspecified as to length of time between rupture and onset of labor, unspecified weeks of gestation: Secondary | ICD-10-CM | POA: Diagnosis present

## 2017-04-08 DIAGNOSIS — O322XX0 Maternal care for transverse and oblique lie, not applicable or unspecified: Secondary | ICD-10-CM | POA: Diagnosis present

## 2017-04-08 LAB — CBC
HCT: 37.2 % (ref 35.0–47.0)
Hemoglobin: 13 g/dL (ref 12.0–16.0)
MCH: 31.1 pg (ref 26.0–34.0)
MCHC: 34.8 g/dL (ref 32.0–36.0)
MCV: 89.3 fL (ref 80.0–100.0)
Platelets: 149 10*3/uL — ABNORMAL LOW (ref 150–440)
RBC: 4.17 MIL/uL (ref 3.80–5.20)
RDW: 15 % — AB (ref 11.5–14.5)
WBC: 8.6 10*3/uL (ref 3.6–11.0)

## 2017-04-08 LAB — TYPE AND SCREEN
ABO/RH(D): O POS
ANTIBODY SCREEN: NEGATIVE

## 2017-04-08 MED ORDER — ONDANSETRON HCL 4 MG/2ML IJ SOLN: 4.0000 mg | Freq: Four times a day (QID) | INTRAMUSCULAR | Status: DC | PRN

## 2017-04-08 MED ORDER — TERBUTALINE SULFATE 1 MG/ML IJ SOLN: 0.2500 mg | Freq: Once | INTRAMUSCULAR | Status: DC | PRN

## 2017-04-08 MED ORDER — LIDOCAINE HCL (PF) 1 % IJ SOLN
30.0000 mL | INTRAMUSCULAR | Status: DC | PRN
  Administered 2017-04-09: 30 mL via SUBCUTANEOUS

## 2017-04-08 MED ORDER — SODIUM CHLORIDE 0.9 % IV SOLN
2.0000 g | Freq: Once | INTRAVENOUS | Status: AC
  Administered 2017-04-08: 2 g via INTRAVENOUS

## 2017-04-08 MED ORDER — SODIUM CHLORIDE 0.9 % IV SOLN
INTRAVENOUS | Status: AC
  Administered 2017-04-08: 2 g via INTRAVENOUS
  Filled 2017-04-08: qty 2000

## 2017-04-08 MED ORDER — SODIUM CHLORIDE 0.9 % IV SOLN
1.0000 g | INTRAVENOUS | Status: DC
  Administered 2017-04-08 – 2017-04-09 (×3): 1 g via INTRAVENOUS
  Filled 2017-04-08 (×3): qty 1000

## 2017-04-08 MED ORDER — SODIUM CHLORIDE 0.9 % IV SOLN: 50.0000 mg | Freq: Two times a day (BID) | INTRAVENOUS | Status: DC

## 2017-04-08 MED ORDER — METHIMAZOLE 5 MG PO TABS
5.0000 mg | ORAL_TABLET | Freq: Every day | ORAL | Status: DC
  Administered 2017-04-09: 5 mg via ORAL
  Filled 2017-04-08 (×2): qty 1

## 2017-04-08 MED ORDER — LACTATED RINGERS IV SOLN
INTRAVENOUS | Status: DC
  Administered 2017-04-08 – 2017-04-09 (×3): via INTRAVENOUS

## 2017-04-08 MED ORDER — LACTATED RINGERS IV SOLN
500.0000 mL | INTRAVENOUS | Status: DC | PRN
  Administered 2017-04-09: 1000 mL via INTRAVENOUS

## 2017-04-08 MED ORDER — OXYTOCIN 40 UNITS IN LACTATED RINGERS INFUSION - SIMPLE MED
1.0000 m[IU]/min | INTRAVENOUS | Status: DC
  Administered 2017-04-09: 1 m[IU]/min via INTRAVENOUS

## 2017-04-08 MED ORDER — OXYTOCIN 40 UNITS IN LACTATED RINGERS INFUSION - SIMPLE MED
2.5000 [IU]/h | INTRAVENOUS | Status: DC
  Administered 2017-04-09: 2.5 [IU]/h via INTRAVENOUS
  Filled 2017-04-08: qty 1000

## 2017-04-08 MED ORDER — OXYTOCIN BOLUS FROM INFUSION
500.0000 mL | Freq: Once | INTRAVENOUS | Status: AC
  Administered 2017-04-09: 500 mL via INTRAVENOUS

## 2017-04-08 MED ORDER — BUTORPHANOL TARTRATE 1 MG/ML IJ SOLN: 1.0000 mg | INTRAMUSCULAR | Status: DC | PRN

## 2017-04-08 MED ORDER — FAMOTIDINE IN NACL 20-0.9 MG/50ML-% IV SOLN
20.0000 mg | Freq: Two times a day (BID) | INTRAVENOUS | Status: DC
  Administered 2017-04-08: 20 mg via INTRAVENOUS
  Filled 2017-04-08: qty 50

## 2017-04-08 MED ORDER — BUTORPHANOL TARTRATE 1 MG/ML IJ SOLN
1.0000 mg | INTRAMUSCULAR | Status: DC | PRN
  Administered 2017-04-09: 1 mg via INTRAVENOUS
  Filled 2017-04-08: qty 1

## 2017-04-08 MED ORDER — ACETAMINOPHEN 325 MG PO TABS: 650.0000 mg | ORAL_TABLET | ORAL | Status: DC | PRN

## 2017-04-08 MED ORDER — SOD CITRATE-CITRIC ACID 500-334 MG/5ML PO SOLN
30.0000 mL | ORAL | Status: DC | PRN
  Filled 2017-04-08: qty 30

## 2017-04-08 NOTE — H&P (Signed)
Samantha Rose is a 26 y.o. female presenting for LOF since last PM . OB History    Gravida Para Term Preterm AB Living   2 0 0 0 1 0   SAB TAB Ectopic Multiple Live Births   1 0 0 0 0     Past Medical History:  Diagnosis Date  . Hyperthyroidism    Past Surgical History:  Procedure Laterality Date  . BREAST CYST EXCISION Right   . LAPAROSCOPY N/A 06/01/2016   Procedure: LAPAROSCOPY DIAGNOSTIC;  Surgeon: Benjaman Kindler, MD;  Location: [facility] ORS;  Service: Gynecology;  Laterality: N/A;   Family History: family history is not on file. Social History:  reports that she has quit smoking. Her smoking use included Cigarettes. She has never used smokeless tobacco. She reports that she does not drink alcohol or use drugs.     Maternal Diabetes: No Genetic Screening: Normal Maternal Ultrasounds/Referrals: Normal Fetal Ultrasounds or other Referrals:  None Maternal Substance Abuse:  No Significant Maternal Medications:  None Significant Maternal Lab Results:  None Other Comments:  None  ROS History   Blood pressure 107/62, pulse 92, temperature 98.3 F (36.8 C), temperature source Oral, resp. rate 16, height 5\' 3"  (1.6 m), weight 85.3 kg (188 lb), last menstrual period 06/26/2016, SpO2 100 %. Exam Physical Exam  Lungs CTA  CV RRR  Abd: gravid soft nt   reassuring fetal monitoring  Prenatal labs: ABO, O+ Antibody:neg  Rubella:  imm RPR:   neg HBsAg:   neg HIV:   neg GBS:   positive   Assessment/Plan: Admit  GBS prophylaxis  Add Pitocin if inadequate labor   Stadol prn pain 1 mg q 2 hr s Gwen Her Sc[place]horn 04/08/2017, 5:44 PM

## 2017-04-08 NOTE — OB Triage Note (Signed)
Pt presents from the office for ROM. States she started leaking last night around 2300. Denies any pain, NVD, or bleeding. Reports positive fetal movement.

## 2017-04-09 ENCOUNTER — Inpatient Hospital Stay: Payer: 59 | Admitting: Anesthesiology

## 2017-04-09 ENCOUNTER — Encounter: Payer: Self-pay | Admitting: Anesthesiology

## 2017-04-09 LAB — T4, FREE: FREE T4: 0.55 ng/dL — AB (ref 0.61–1.12)

## 2017-04-09 LAB — TSH: TSH: 0.958 u[IU]/mL (ref 0.350–4.500)

## 2017-04-09 LAB — RPR: RPR: NONREACTIVE

## 2017-04-09 MED ORDER — DIBUCAINE 1 % RE OINT: 1.0000 "application " | TOPICAL_OINTMENT | RECTAL | Status: DC | PRN

## 2017-04-09 MED ORDER — ONDANSETRON HCL 4 MG/2ML IJ SOLN: 4.0000 mg | INTRAMUSCULAR | Status: DC | PRN

## 2017-04-09 MED ORDER — SIMETHICONE 80 MG PO CHEW: 80.0000 mg | CHEWABLE_TABLET | ORAL | Status: DC | PRN

## 2017-04-09 MED ORDER — PRENATAL MULTIVITAMIN CH
1.0000 | ORAL_TABLET | Freq: Every day | ORAL | Status: DC
  Administered 2017-04-09 – 2017-04-11 (×3): 1 via ORAL
  Filled 2017-04-09 (×3): qty 1

## 2017-04-09 MED ORDER — ONDANSETRON HCL 4 MG PO TABS: 4.0000 mg | ORAL_TABLET | ORAL | Status: DC | PRN

## 2017-04-09 MED ORDER — OXYTOCIN 10 UNIT/ML IJ SOLN
INTRAMUSCULAR | Status: AC
  Filled 2017-04-09: qty 2

## 2017-04-09 MED ORDER — LACTATED RINGERS IV SOLN
500.0000 mL | Freq: Once | INTRAVENOUS | Status: AC
  Administered 2017-04-09: 500 mL via INTRAVENOUS

## 2017-04-09 MED ORDER — WITCH HAZEL-GLYCERIN EX PADS: 1.0000 "application " | MEDICATED_PAD | CUTANEOUS | Status: DC | PRN

## 2017-04-09 MED ORDER — FAMOTIDINE 20 MG PO TABS: 20.0000 mg | ORAL_TABLET | Freq: Two times a day (BID) | ORAL | Status: DC | PRN

## 2017-04-09 MED ORDER — PHENYLEPHRINE 40 MCG/ML (10ML) SYRINGE FOR IV PUSH (FOR BLOOD PRESSURE SUPPORT)
80.0000 ug | PREFILLED_SYRINGE | INTRAVENOUS | Status: DC | PRN
  Filled 2017-04-09: qty 5

## 2017-04-09 MED ORDER — EPHEDRINE 5 MG/ML INJ
10.0000 mg | INTRAVENOUS | Status: DC | PRN
  Filled 2017-04-09: qty 2

## 2017-04-09 MED ORDER — AMMONIA AROMATIC IN INHA
RESPIRATORY_TRACT | Status: AC
  Filled 2017-04-09: qty 10

## 2017-04-09 MED ORDER — METHIMAZOLE 5 MG PO TABS
5.0000 mg | ORAL_TABLET | Freq: Every day | ORAL | Status: DC
  Administered 2017-04-10 – 2017-04-11 (×2): 5 mg via ORAL
  Filled 2017-04-09 (×2): qty 1

## 2017-04-09 MED ORDER — LIDOCAINE HCL (PF) 1 % IJ SOLN
INTRAMUSCULAR | Status: AC
  Administered 2017-04-09: 30 mL via SUBCUTANEOUS
  Filled 2017-04-09: qty 30

## 2017-04-09 MED ORDER — LIDOCAINE-EPINEPHRINE (PF) 1.5 %-1:200000 IJ SOLN: INTRAMUSCULAR | Status: DC | PRN

## 2017-04-09 MED ORDER — MISOPROSTOL 200 MCG PO TABS
ORAL_TABLET | ORAL | Status: AC
  Filled 2017-04-09: qty 4

## 2017-04-09 MED ORDER — MEASLES, MUMPS & RUBELLA VAC ~~LOC~~ INJ
0.5000 mL | INJECTION | Freq: Once | SUBCUTANEOUS | Status: DC
  Filled 2017-04-09: qty 0.5

## 2017-04-09 MED ORDER — FERROUS SULFATE 325 (65 FE) MG PO TABS
325.0000 mg | ORAL_TABLET | Freq: Two times a day (BID) | ORAL | Status: DC
  Administered 2017-04-09 – 2017-04-11 (×5): 325 mg via ORAL
  Filled 2017-04-09 (×5): qty 1

## 2017-04-09 MED ORDER — BUPIVACAINE HCL (PF) 0.25 % IJ SOLN
INTRAMUSCULAR | Status: DC | PRN
  Administered 2017-04-09: 10 mL via EPIDURAL

## 2017-04-09 MED ORDER — HYDROCODONE-ACETAMINOPHEN 5-325 MG PO TABS
1.0000 | ORAL_TABLET | ORAL | Status: DC | PRN
  Administered 2017-04-09 – 2017-04-10 (×3): 1 via ORAL
  Filled 2017-04-09 (×3): qty 1

## 2017-04-09 MED ORDER — LIDOCAINE HCL (PF) 1 % IJ SOLN
INTRAMUSCULAR | Status: DC | PRN
  Administered 2017-04-09: 3 mL

## 2017-04-09 MED ORDER — LIDOCAINE-EPINEPHRINE (PF) 1.5 %-1:200000 IJ SOLN
INTRAMUSCULAR | Status: DC | PRN
  Administered 2017-04-09: 3 mL via PERINEURAL

## 2017-04-09 MED ORDER — MAGNESIUM HYDROXIDE 400 MG/5ML PO SUSP: 30.0000 mL | ORAL | Status: DC | PRN

## 2017-04-09 MED ORDER — SENNOSIDES-DOCUSATE SODIUM 8.6-50 MG PO TABS
2.0000 | ORAL_TABLET | ORAL | Status: DC
  Administered 2017-04-10 – 2017-04-11 (×2): 2 via ORAL
  Filled 2017-04-09 (×2): qty 2

## 2017-04-09 MED ORDER — ZOLPIDEM TARTRATE 5 MG PO TABS: 5.0000 mg | ORAL_TABLET | Freq: Every evening | ORAL | Status: DC | PRN

## 2017-04-09 MED ORDER — BENZOCAINE-MENTHOL 20-0.5 % EX AERO
1.0000 "application " | INHALATION_SPRAY | CUTANEOUS | Status: DC | PRN
  Filled 2017-04-09: qty 56

## 2017-04-09 MED ORDER — COCONUT OIL OIL: 1.0000 "application " | TOPICAL_OIL | Status: DC | PRN

## 2017-04-09 MED ORDER — FENTANYL 2.5 MCG/ML W/ROPIVACAINE 0.2% IN NS 100 ML EPIDURAL INFUSION (ARMC-ANES)
EPIDURAL | Status: AC
  Filled 2017-04-09: qty 100

## 2017-04-09 MED ORDER — IBUPROFEN 600 MG PO TABS
600.0000 mg | ORAL_TABLET | Freq: Four times a day (QID) | ORAL | Status: DC
  Administered 2017-04-09 – 2017-04-11 (×8): 600 mg via ORAL
  Filled 2017-04-09 (×9): qty 1

## 2017-04-09 MED ORDER — DIPHENHYDRAMINE HCL 25 MG PO CAPS: 25.0000 mg | ORAL_CAPSULE | Freq: Four times a day (QID) | ORAL | Status: DC | PRN

## 2017-04-09 MED ORDER — FENTANYL 2.5 MCG/ML W/ROPIVACAINE 0.2% IN NS 100 ML EPIDURAL INFUSION (ARMC-ANES)
9.0000 mL/h | EPIDURAL | Status: DC
  Administered 2017-04-09: 9 mL/h via EPIDURAL

## 2017-04-09 MED ORDER — DIPHENHYDRAMINE HCL 50 MG/ML IJ SOLN: 12.5000 mg | INTRAMUSCULAR | Status: DC | PRN

## 2017-04-09 MED ORDER — PHENYLEPHRINE 40 MCG/ML (10ML) SYRINGE FOR IV PUSH (FOR BLOOD PRESSURE SUPPORT)
80.0000 ug | PREFILLED_SYRINGE | INTRAVENOUS | Status: DC | PRN
  Administered 2017-04-09 (×2): 40 ug via INTRAVENOUS
  Filled 2017-04-09: qty 5

## 2017-04-09 MED ORDER — ACETAMINOPHEN 325 MG PO TABS: 650.0000 mg | ORAL_TABLET | ORAL | Status: DC | PRN

## 2017-04-09 MED ORDER — METHIMAZOLE 5 MG PO TABS
5.0000 mg | ORAL_TABLET | Freq: Every day | ORAL | Status: DC
  Filled 2017-04-09: qty 1

## 2017-04-09 NOTE — Anesthesia Procedure Notes (Signed)
Epidural Patient location during procedure: OB Start time: 04/09/2017 3:41 AM End time: 04/09/2017 3:54 AM  Staffing Anesthesiologist: Alvin Critchley Performed: anesthesiologist   Preanesthetic Checklist Completed: patient identified, site marked, surgical consent, pre-op evaluation, timeout performed, IV checked, risks and benefits discussed and monitors and equipment checked  Epidural Patient position: sitting Prep: Betadine and site prepped and draped Patient monitoring: heart rate, continuous pulse ox, blood pressure and cardiac monitor Approach: midline Location: L3-L4 Injection technique: LOR air  Needle:  Needle type: Tuohy  Needle gauge: 18 G Needle length: 9 cm and 9 Catheter type: closed end flexible Catheter size: 20 Guage Test dose: negative and 1.5% lidocaine with Epi 1:200 K  Assessment Sensory level: T8 Events: blood not aspirated, injection not painful, no injection resistance, negative IV test and no paresthesia  Additional Notes Time out called.  Patient placed in sitting position.  Back prepped and draped in sterile fashion.  A skin wheal was made in the L3-L4 interspace with 1% Lidocaine plain.  An 18G Tuohy needle was advanced into the epidural space by a loss of resistance technique.  The epidural catheter was threaded 3 cm into the epidural space and the TD was negative.  Th e patient tolerated the procedure well .  No paresthesias or blood noted.  The catheter was affixed to the back in sterile fashion.Reason for block:procedure for pain

## 2017-04-09 NOTE — Discharge Summary (Signed)
Obstetric Discharge Summary   Patient ID: Patient Name: Samantha Rose DOB: 16-Aug-1991 MRN: [id number]  Date of Admission: 04/08/2017 Date of Discharge: 04/11/17  Primary OB: [facility]  Gestational Age at Delivery: [redacted]w[redacted]d   Antepartum complications:  hyperthyroidism , managed with PTU---> tapazole( Dr Gabriel Carina) Admitting Diagnosis: SROM   Secondary Diagnoses: Patient Active Problem List   Diagnosis Date Noted  . Leakage of amniotic fluid 04/08/2017  . Pregnancy 02/25/2017  . Indication for care in labor or delivery 02/22/2017  . Labor and delivery, indication for care 02/04/2017  . Vaginal bleeding 12/23/2016  . First trimester screening 10/07/2016    Augmentation: Pitocin Complications: None Intrapartum complications/course:  Push for 80 minutes and fetal head was in the LOT position . Kielland 90 degree rotation performed with delivery of fetal head Date of Delivery: 04/09/17 Delivered By: Laverta Baltimore MD Delivery Type: forceps, low Anesthesia: epidural Placenta: sponatneous Laceration:  Episiotomy: none  Newborn Data: Live born female  Birth Weight: 6 lb 12.3 oz (3070 g) APGAR: 8, 9    Postpartum Course  Patient had an uncomplicated postpartum course.  By time of discharge on PPD#1 her pain was controlled on oral pain medications; she had appropriate lochia and was ambulating, voiding without difficulty and tolerating regular diet.  She was deemed stable for discharge to home.     Labs: CBC Latest Ref Rng & Units 04/10/2017 04/08/2017 02/20/2017  WBC 3.6 - 11.0 K/uL 9.9 8.6 8.3  Hemoglobin 12.0 - 16.0 g/dL 10.4(L) 13.0 11.1(L)  Hematocrit 35.0 - 47.0 % 29.4(L) 37.2 30.8(L)  Platelets 150 - 440 K/uL 134(L) 149(L) 136(L)   O POS  Physical exam:  BP 112/63 (BP Location: Right Arm)   Pulse 86   Temp 98.2 F (36.8 C) (Oral)   Resp 18   Ht 5\' 3"  (1.6 m)   Wt 188 lb (85.3 kg)   LMP 06/26/2016   SpO2 100%   Breastfeeding? Unknown   BMI 33.30  kg/m  General: alert and no distress Pulm: normal respiratory effort Lochia: appropriate Abdomen: soft, NT Uterine Fundus: firm, below umbilicus Laceration: c/d/i, healing well, no significant drainage, no dehiscence, no significant erythema Extremities: No evidence of DVT seen on physical exam. No lower extremity edema.   Disposition: stable, discharge to home  Baby Disposition: home with mom unless needs bililights, and then mom will room in. To be determined by peds later today  Prenatal Labs: GBS +, HepBSAG neg. HIV Neg, VI, RI, GC/CH neg. H/o hyperthyroidism and is on tapazole  5 mg     Plan:  ARMELIA PENTON was discharged to home in good condition. [facility]  Discharge Instructions: Per After Visit Summary. Activity: Advance as tolerated. Pelvic rest for 6 weeks.  Refer to After Visit Summary Diet: Regular Discharge Medications: Allergies as of 04/11/2017   No Known Allergies     Medication List    TAKE these medications   ascorbic acid 250 MG tablet Commonly known as:  VITAMIN C Take 1 tablet (250 mg total) by mouth 2 (two) times daily with a meal. Take with iron for anemia   docusate sodium 100 MG capsule Commonly known as:  COLACE Take 1 capsule (100 mg total) by mouth daily as needed for mild constipation.   ferrous sulfate 325 (65 FE) MG tablet Take 325 mg by mouth daily with breakfast.   ibuprofen 800 MG tablet Commonly known as:  ADVIL,MOTRIN Take 1 tablet (800 mg  total) by mouth every 8 (eight) hours as needed for moderate pain or cramping.   methimazole 5 MG tablet Commonly known as:  TAPAZOLE Take 5 mg by mouth daily.   multivitamin-prenatal 27-0.8 MG Tabs tablet Take 1 tablet by mouth daily at 12 noon.   ranitidine 150 MG tablet Commonly known as:  ZANTAC Take 150 mg by mouth 2 (two) times daily.      Outpatient follow up:    Signed:  Benjaman Kindler

## 2017-04-09 NOTE — Anesthesia Preprocedure Evaluation (Signed)
Anesthesia Evaluation  Patient identified by MRN, date of birth, ID band Patient awake    Reviewed: Allergy & Precautions, NPO status , Patient's Chart, lab work & pertinent test results  History of Anesthesia Complications Negative for: history of anesthetic complications  Airway Mallampati: II       Dental  (+) Teeth Intact   Pulmonary Current Smoker, former smoker,    breath sounds clear to auscultation       Cardiovascular Exercise Tolerance: Good negative cardio ROS   Rhythm:Regular     Neuro/Psych negative neurological ROS  negative psych ROS   GI/Hepatic negative GI ROS, Neg liver ROS,   Endo/Other  negative endocrine ROSHypothyroidism Hyperthyroidism   Renal/GU negative Renal ROS  negative genitourinary   Musculoskeletal negative musculoskeletal ROS (+)   Abdominal Normal abdominal exam  (+)   Peds negative pediatric ROS (+)  Hematology negative hematology ROS (+)   Anesthesia Other Findings   Reproductive/Obstetrics (+) Pregnancy                             Anesthesia Physical  Anesthesia Plan  ASA: II  Anesthesia Plan: Epidural   Post-op Pain Management:    Induction:   Airway Management Planned: Natural Airway  Additional Equipment:   Intra-op Plan:   Post-operative Plan:   Informed Consent: I have reviewed the patients History and Physical, chart, labs and discussed the procedure including the risks, benefits and alternatives for the proposed anesthesia with the patient or authorized representative who has indicated his/her understanding and acceptance.     Plan Discussed with: CRNA and Surgeon  Anesthesia Plan Comments:         Anesthesia Quick Evaluation

## 2017-04-10 LAB — CBC
HEMATOCRIT: 29.4 % — AB (ref 35.0–47.0)
Hemoglobin: 10.4 g/dL — ABNORMAL LOW (ref 12.0–16.0)
MCH: 31.8 pg (ref 26.0–34.0)
MCHC: 35.3 g/dL (ref 32.0–36.0)
MCV: 90.1 fL (ref 80.0–100.0)
Platelets: 134 10*3/uL — ABNORMAL LOW (ref 150–440)
RBC: 3.26 MIL/uL — ABNORMAL LOW (ref 3.80–5.20)
RDW: 14.9 % — AB (ref 11.5–14.5)
WBC: 9.9 10*3/uL (ref 3.6–11.0)

## 2017-04-10 NOTE — Progress Notes (Signed)
Post Partum Day 1 Subjective: Doing well, no complaints.  Tolerating regular diet, pain with PO meds, voiding and ambulating without difficulty.  Feels overwhelmed.  No CP SOB F/C N/V or leg pain   Objective: BP (!) 104/51 (BP Location: Left Arm)   Pulse 77   Temp 98.6 F (37 C) (Oral)   Resp 17   Ht 5\' 3"  (1.6 m)   Wt 85.3 kg (188 lb)   LMP 06/26/2016   SpO2 100%   Breastfeeding? Unknown   BMI 33.30 kg/m    Physical Exam:  General: NAD CV: RRR Pulm: nl effort, CTABL Lochia: moderate Uterine Fundus: fundus firm and below umbilicus DVT Evaluation: no cords, ttp LEs    Recent Labs  04/08/17 1708 04/10/17 0448  HGB 13.0 10.4*  HCT 37.2 29.4*  WBC 8.6 9.9  PLT 149* 134*    Assessment/Plan: 26 y.o. G2P1011 postpartum day # 1  1. Routine post partum care, nursing support and encouragment 2. Anticipate d/c tomorrow, if baby is able to be d/c'd (jaundice)    ----- Larey Days, MD Attending Obstetrician and [facility]

## 2017-04-10 NOTE — Op Note (Addendum)
NAMEMarland Rose  AVRIEL, KANDEL NO.:  [id number]  MEDICAL RECORD NO.:  [id number]  LOCATION:  OBS2                         FACILITY:  [facility]  PHYSICIAN:  Laverta Baltimore, MDDATE OF BIRTH:  1991/05/10  DATE OF PROCEDURE: 04/09/2017 DATE OF DISCHARGE: 04/11/2017                              OPERATIVE REPORT   PREOPERATIVE DIAGNOSIS:  Second-stage labor.  The fetal head in the left occiput transverse position.  PROCEDURE PERFORMED: 1. Low Kielland forceps delivery with 90 degree rotation. 2. Repair of second-degree laceration.  SURGEON:  Laverta Baltimore, MD  INDICATION:  This is a 26 year old gravida 1, para 0 patient was laboring for 1 hour and 20 minutes and did not push the fetal head past +3/3 station.  Exam reveals the fetal head to be in the left occiput transverse position, which was verified by bedside ultrasound.  A manual attempt to rotate the fetal head in the occiput anterior position failed, therefore, the patient was counseled regarding application of Kielland forceps with rotation and delivery of the head.  Risks were briefly described to the patient.  The patient agreed to the procedure. All questions answered.  DESCRIPTION OF PROCEDURE:  The patient was prepped with Betadine.  Foley catheter was removed as well as the intrauterine pressure catheter.  The anterior blade of the Kielland forceps was placed in the classic maneuver and the posterior blade was placed without difficulty and the forceps articulated without difficulty.  Fetal head was then rotated in a counter-clockwise position, 90 degrees, to bring the head in the direct OA position.  Forceps were kept in place and with the next maternal effort, the fetal head was brought through the perineum and the forceps were removed.  The shoulders and body were delivered without difficulty.  Vigorous female was placed on the patient's abdomen.  There were no forceps markings visible.  Good  application of the forceps were noted with the delivery of the head.  After 60 seconds, the umbilical cord was doubly clamped and cut.  A vigorous infant female with a fine Apgar scores of 8 and 9, weight of 3070, time of birth was 08:17 on April 09, 2017.  Placenta delivered shortly thereafter.  Good hemostasis was noted.  There was a 5 cm vaginal laceration on the right posterior side and perineal body laceration consistent with a second-degree laceration.  Second-degree laceration repaired in the standard fashion with 2-0 Vicryl and 3-0 Vicryl.  Good approximation of tissues.  Rectal exam performed at the end with good rectal tone.  There were no complications.  The patient tolerated the procedure well.  ESTIMATED BLOOD LOSS:  250 mL.         ______________________________ Laverta Baltimore, MD    TS/MEDQ  D:  04/09/2017  T:  04/10/2017  Job:  [id number]

## 2017-04-10 NOTE — Anesthesia Postprocedure Evaluation (Signed)
Anesthesia Post Note  Patient: Samantha Rose  Procedure(s) Performed: * No procedures listed *  Patient location during evaluation: Mother Baby Anesthesia Type: Epidural Level of consciousness: awake and alert and oriented Pain management: pain level controlled Vital Signs Assessment: post-procedure vital signs reviewed and stable Respiratory status: spontaneous breathing, nonlabored ventilation and respiratory function stable Cardiovascular status: stable Postop Assessment: no headache, no backache, epidural receding and no signs of nausea or vomiting (no pruritis) Anesthetic complications: no     Last Vitals:  Vitals:   04/10/17 0415 04/10/17 0733  BP: (!) 106/51 (!) 104/51  Pulse: 73 77  Resp: 18 17  Temp: 36.6 C 37 C    Last Pain:  Vitals:   04/10/17 0845  TempSrc:   PainSc: 0-No pain                 Bostyn Bogie

## 2017-04-11 MED ORDER — ASCORBIC ACID 250 MG PO TABS: 250.0000 mg | ORAL_TABLET | Freq: Two times a day (BID) | ORAL | 3 refills | Status: AC

## 2017-04-11 MED ORDER — DOCUSATE SODIUM 100 MG PO CAPS: 100.0000 mg | ORAL_CAPSULE | Freq: Every day | ORAL | 3 refills | Status: DC | PRN

## 2017-04-11 MED ORDER — IBUPROFEN 800 MG PO TABS: 800.0000 mg | ORAL_TABLET | Freq: Three times a day (TID) | ORAL | 0 refills | Status: AC | PRN

## 2017-04-11 NOTE — Progress Notes (Signed)
All discharge instructions given to patient and she voiced understanding of all instructions given. She will make her own f/u appt for 6 wks. meds are otc. Patient discharged home with family escorted out by cna.

## 2017-04-11 NOTE — Discharge Instructions (Signed)
Follow up sooner with fever, problems breathing, pain not helped by medications, severe depression( more than just baby blues, wanting to hurt yourself or the baby), severe bleeding ( saturating more than one pad an hour or large palm sized clots), no heavy lifting , no driving while taking narcotics, no douches, intercourse, tampons or enemas for 6 weeks Vaginal Delivery, Care After Refer to this sheet in the next few weeks. These instructions provide you with information about caring for yourself after vaginal delivery. Your health care provider may also give you more specific instructions. Your treatment has been planned according to current medical practices, but problems sometimes occur. Call your health care provider if you have any problems or questions. What can I expect after the procedure? After vaginal delivery, it is common to have:  Some bleeding from your vagina.  Soreness in your abdomen, your vagina, and the area of skin between your vaginal opening and your anus (perineum).  Pelvic cramps.  Fatigue. Follow these instructions at home: Medicines   Take over-the-counter and prescription medicines only as told by your health care provider.  If you were prescribed an antibiotic medicine, take it as told by your health care provider. Do not stop taking the antibiotic until it is finished. Driving    Do not drive or operate heavy machinery while taking prescription pain medicine.  Do not drive for 24 hours if you received a sedative. Lifestyle   Do not drink alcohol. This is especially important if you are breastfeeding or taking medicine to relieve pain.  Do not use tobacco products, including cigarettes, chewing tobacco, or e-cigarettes. If you need help quitting, ask your health care provider. Eating and drinking   Drink at least 8 eight-ounce glasses of water every day unless you are told not to by your health care provider. If you choose to breastfeed your baby, you may  need to drink more water than this.  Eat high-fiber foods every day. These foods may help prevent or relieve constipation. High-fiber foods include:  Whole grain cereals and breads.  Brown rice.  Beans.  Fresh fruits and vegetables. Activity   Return to your normal activities as told by your health care provider. Ask your health care provider what activities are safe for you.  Rest as much as possible. Try to rest or take a nap when your baby is sleeping.  Do not lift anything that is heavier than your baby or 10 lb (4.5 kg) until your health care provider says that it is safe.  Talk with your health care provider about when you can engage in sexual activity. This may depend on your:  Risk of infection.  Rate of healing.  Comfort and desire to engage in sexual activity. Vaginal Care   If you have an episiotomy or a vaginal tear, check the area every day for signs of infection. Check for:  More redness, swelling, or pain.  More fluid or blood.  Warmth.  Pus or a bad smell.  Do not use tampons or douches until your health care provider says this is safe.  Watch for any blood clots that may pass from your vagina. These may look like clumps of dark red, brown, or black discharge. General instructions   Keep your perineum clean and dry as told by your health care provider.  Wear loose, comfortable clothing.  Wipe from front to back when you use the toilet.  Ask your health care provider if you can shower or take a bath.  If you had an episiotomy or a perineal tear during labor and delivery, your health care provider may tell you not to take baths for a certain length of time.  Wear a bra that supports your breasts and fits you well.  If possible, have someone help you with household activities and help care for your baby for at least a few days after you leave the hospital.  Keep all follow-up visits for you and your baby as told by your health care provider. This is  important. Contact a health care provider if:  You have:  Vaginal discharge that has a bad smell.  Difficulty urinating.  Pain when urinating.  A sudden increase or decrease in the frequency of your bowel movements.  More redness, swelling, or pain around your episiotomy or vaginal tear.  More fluid or blood coming from your episiotomy or vaginal tear.  Pus or a bad smell coming from your episiotomy or vaginal tear.  A fever.  A rash.  Little or no interest in activities you used to enjoy.  Questions about caring for yourself or your baby.  Your episiotomy or vaginal tear feels warm to the touch.  Your episiotomy or vaginal tear is separating or does not appear to be healing.  Your breasts are painful, hard, or turn red.  You feel unusually sad or worried.  You feel nauseous or you vomit.  You pass large blood clots from your vagina. If you pass a blood clot from your vagina, save it to show to your health care provider. Do not flush blood clots down the toilet without having your health care provider look at them.  You urinate more than usual.  You are dizzy or light-headed.  You have not breastfed at all and you have not had a menstrual period for 12 weeks after delivery.  You have stopped breastfeeding and you have not had a menstrual period for 12 weeks after you stopped breastfeeding. Get help right away if:  You have:  Pain that does not go away or does not get better with medicine.  Chest pain.  Difficulty breathing.  Blurred vision or spots in your vision.  Thoughts about hurting yourself or your baby.  You develop pain in your abdomen or in one of your legs.  You develop a severe headache.  You faint.  You bleed from your vagina so much that you fill two sanitary pads in one hour. This information is not intended to replace advice given to you by your health care provider. Make sure you discuss any questions you have with your health care  provider. Document Released: 11/26/2000 Document Revised: 05/12/2016 Document Reviewed: 12/14/2015 Elsevier Interactive Patient Education  2017 [employer].

## 2017-12-01 ENCOUNTER — Emergency Department
Admission: EM | Admit: 2017-12-01 | Discharge: 2017-12-01 | Disposition: A | Discharge status: Home / Self Care | Payer: 59 | Attending: Emergency Medicine | Admitting: Emergency Medicine

## 2017-12-01 DIAGNOSIS — Z87891 Personal history of nicotine dependence: Secondary | ICD-10-CM | POA: Insufficient documentation

## 2017-12-01 DIAGNOSIS — N938 Other specified abnormal uterine and vaginal bleeding: Secondary | ICD-10-CM | POA: Insufficient documentation

## 2017-12-01 DIAGNOSIS — Z79899 Other long term (current) drug therapy: Secondary | ICD-10-CM | POA: Insufficient documentation

## 2017-12-01 HISTORY — DX: Leiomyoma of uterus, unspecified: D25.9

## 2017-12-01 HISTORY — DX: Maternal care for benign tumor of corpus uteri, unspecified trimester: O34.10

## 2017-12-01 LAB — BASIC METABOLIC PANEL
ANION GAP: 5 (ref 5–15)
BUN: 12 mg/dL (ref 6–20)
CO2: 25 mmol/L (ref 22–32)
Calcium: 9 mg/dL (ref 8.9–10.3)
Chloride: 106 mmol/L (ref 101–111)
Creatinine, Ser: 0.73 mg/dL (ref 0.44–1.00)
GFR calc Af Amer: >60 mL/min (ref >60)
GLUCOSE: 90 mg/dL (ref 65–99)
POTASSIUM: 3.7 mmol/L (ref 3.5–5.1)
Sodium: 136 mmol/L (ref 135–145)

## 2017-12-01 LAB — URINALYSIS, COMPLETE (UACMP) WITH MICROSCOPIC
Bacteria, UA: NONE SEEN
Bilirubin Urine: NEGATIVE
GLUCOSE, UA: NEGATIVE mg/dL
Ketones, ur: NEGATIVE mg/dL
Nitrite: NEGATIVE
PROTEIN: 30 mg/dL — AB
Specific Gravity, Urine: 1.023 (ref 1.005–1.030)
pH: 6 (ref 5.0–8.0)

## 2017-12-01 LAB — CBC
HEMATOCRIT: 41.3 % (ref 35.0–47.0)
HEMOGLOBIN: 13.9 g/dL (ref 12.0–16.0)
MCH: 29.5 pg (ref 26.0–34.0)
MCHC: 33.7 g/dL (ref 32.0–36.0)
MCV: 87.7 fL (ref 80.0–100.0)
Platelets: 231 10*3/uL (ref 150–440)
RBC: 4.71 MIL/uL (ref 3.80–5.20)
RDW: 14.2 % (ref 11.5–14.5)
WBC: 4.4 10*3/uL (ref 3.6–11.0)

## 2017-12-01 LAB — HCG, QUANTITATIVE, PREGNANCY

## 2017-12-01 LAB — POCT PREGNANCY, URINE: Preg Test, Ur: NEGATIVE

## 2017-12-01 MED ORDER — MEDROXYPROGESTERONE ACETATE 10 MG PO TABS: 20.0000 mg | ORAL_TABLET | Freq: Every day | ORAL | 0 refills | Status: DC

## 2017-12-01 NOTE — ED Notes (Signed)
First nurse note: Pt brought over from [facility] for reports of abdominal pain and vaginal bleeding. [place] reports history of fibroid tumor and ovarian cyst. Pt in no apparent distress, laughing with friend during registration process.

## 2017-12-01 NOTE — ED Notes (Signed)
Pt discharged to home.  Family member driving.  Discharge instructions reviewed.  Verbalized understanding.  No questions or concerns at this time.  Teach back verified.  Pt in NAD.  No items left in ED.   

## 2017-12-01 NOTE — ED Provider Notes (Signed)
[facility] Emergency Department Provider Note  ____________________________________________  Time seen: Approximately 3:35 PM  I have reviewed the triage vital signs and the nursing notes.   HISTORY  Chief Complaint Vaginal Bleeding    HPI Samantha Rose is a 26 y.o. female who complains of vaginal bleeding that started today. Her periods have been irregular recently, with a period that started on about November 20, lasted for 5 days, and then a new period that started on December 3 and lasted for 5 days, and now a period starting today. She does report that she's had moments in time where her periods come more frequently. She denies any severe pain. No dysuria frequency urgency dizziness or syncope. No chest pain or shortness of breath or fevers chills or sweats. Symptoms been intermittent, no aggravating or alleviating factors. Moderate intensity.  She does have a history of hyperthyroidism for which she takes methimazole and is planned to have a thyroidectomy. She also has a history of uterine fibroids.She has an appointment with gynecology scheduled for early January.     Past Medical History:  Diagnosis Date  . Hyperthyroidism   . Uterine fibroids affecting pregnancy, unspecified trimester      Patient Active Problem List   Diagnosis Date Noted  . Leakage of amniotic fluid 04/08/2017  . Pregnancy 02/25/2017  . Indication for care in labor or delivery 02/22/2017  . Labor and delivery, indication for care 02/04/2017  . Vaginal bleeding 12/23/2016  . First trimester screening 10/07/2016     Past Surgical History:  Procedure Laterality Date  . BREAST CYST EXCISION Right   . LAPAROSCOPY N/A 06/01/2016   Procedure: LAPAROSCOPY DIAGNOSTIC;  Surgeon: Benjaman Kindler, MD;  Location: [facility] ORS;  Service: Gynecology;  Laterality: N/A;     Prior to Admission medications   Medication Sig Start Date End Date Taking? Authorizing Provider  docusate  sodium (COLACE) 100 MG capsule Take 1 capsule (100 mg total) by mouth daily as needed for mild constipation. 04/11/17   Benjaman Kindler, MD  ferrous sulfate 325 (65 FE) MG tablet Take 325 mg by mouth daily with breakfast.    [provider]  medroxyPROGESTERone (PROVERA) 10 MG tablet Take 2 tablets (20 mg total) by mouth daily. 12/01/17   Carrie Mew, MD  methimazole (TAPAZOLE) 5 MG tablet Take 5 mg by mouth daily.    [provider]  Prenatal Vit-Fe Fumarate-FA (MULTIVITAMIN-PRENATAL) 27-0.8 MG TABS tablet Take 1 tablet by mouth daily at 12 noon.    [provider]  ranitidine (ZANTAC) 150 MG tablet Take 150 mg by mouth 2 (two) times daily.    [provider]     Allergies Patient has no known allergies.   History reviewed. No pertinent family history.  Social History Social History   Tobacco Use  . Smoking status: Former Smoker    Types: Cigarettes  . Smokeless tobacco: Never Used  Substance Use Topics  . Alcohol use: No  . Drug use: No    Review of Systems  Constitutional:   No fever or chills.  ENT:   No sore throat. No rhinorrhea. Cardiovascular:   No chest pain or syncope. Respiratory:   No dyspnea or cough. Gastrointestinal:   Negative for abdominal pain, vomiting and diarrhea.   All other systems reviewed and are negative except as documented above in ROS and HPI.  ____________________________________________   PHYSICAL EXAM:  VITAL SIGNS: ED Triage Vitals  Enc Vitals Group     BP 12/01/17  1202 127/90     Pulse Rate 12/01/17 1202 77     Resp 12/01/17 1202 20     Temp 12/01/17 1202 97.6 F (36.4 C)     Temp Source 12/01/17 1202 Oral     SpO2 12/01/17 1202 100 %     Weight 12/01/17 1203 150 lb (68 kg)     Height 12/01/17 1203 5\' 4"  (1.626 m)     Head Circumference --      Peak Flow --      Pain Score 12/01/17 1207 6     Pain Loc --      Pain Edu? --      Excl. in [place]? --     Vital signs reviewed, nursing  assessments reviewed.   Constitutional:   Alert and oriented. Well appearing and in no distress. Eyes:   No scleral icterus.  EOMI. no conjunctival pallor. ENT   Head:   Normocephalic and atraumatic.   Nose:   No congestion/rhinnorhea.    Mouth/Throat:   MMM, no pharyngeal erythema. No peritonsillar mass.    Neck:   No meningismus. Full ROM. Thyroid is full but nontender without nodules Hematological/Lymphatic/Immunilogical:   No cervical lymphadenopathy. Cardiovascular:   RRR. Symmetric bilateral radial and DP pulses.  No murmurs.  Respiratory:   Normal respiratory effort without tachypnea/retractions. Breath sounds are clear and equal bilaterally. No wheezes/rales/rhonchi. Gastrointestinal:   Soft and nontender. Non distended. There is no CVA tenderness.  No rebound, rigidity, or guarding. Genitourinary:   deferred Musculoskeletal:   Normal range of motion in all extremities. No joint effusions.  No lower extremity tenderness.  No edema. Neurologic:   Normal speech and language.  Motor grossly intact. No gross focal neurologic deficits are appreciated.   ____________________________________________    LABS (pertinent positives/negatives) (all labs ordered are listed, but only abnormal results are displayed) Labs Reviewed  URINALYSIS, COMPLETE (UACMP) WITH MICROSCOPIC - Abnormal; Notable for the following components:      Result Value   Color, Urine YELLOW (*)    APPearance HAZY (*)    Hgb urine dipstick LARGE (*)    Protein, ur 30 (*)    Leukocytes, UA MODERATE (*)    Squamous Epithelial / LPF 0-5 (*)    All other components within normal limits  HCG, QUANTITATIVE, PREGNANCY  BASIC METABOLIC PANEL  CBC  POCT PREGNANCY, URINE  POC URINE PREG, ED   ____________________________________________   EKG    ____________________________________________    RADIOLOGY  No results  found.  ____________________________________________   PROCEDURES Procedures  ____________________________________________     CLINICAL IMPRESSION / ASSESSMENT AND PLAN / ED COURSE  Pertinent labs & imaging results that were available during my care of the patient were reviewed by me and considered in my medical decision making (see chart for details).   Patient presents with abnormal menses, metrorrhagia. She has a follow-up appointment with gynecology already. Vital signs are normal, blood counts are normal, patient's, comfortable and well-appearing. Not in distress. Suitable for outpatient management. We'll do a trial of Provera for now, and she can follow up with gynecology for further assessment.      ____________________________________________   FINAL CLINICAL IMPRESSION(S) / ED DIAGNOSES    Final diagnoses:  Dysfunctional uterine bleeding      This SmartLink is deprecated. Use AVSMEDLIST instead to display the medication list for a patient.   Portions of this note were generated with dragon dictation software. Dictation errors may occur despite best attempts at proofreading.  Carrie Mew, MD 12/01/17 [phone]

## 2017-12-01 NOTE — ED Triage Notes (Addendum)
Pt states had her normal period in November, started bleeding first week of December, stopped and is now bleeding again. Does not take birth control. Unsure if pregnant. Alert, oriented. States 1 pad per hour. States some clots present. Tried to see OB at [place] but couldn't be seen until January. States has been pregnant before but unsure currently.

## 2017-12-01 NOTE — ED Notes (Signed)
Pt states she had a normal period in November and first of this month, states she started back again 5 days later and has not stopped since. States she has a hx of fibroids and ovarian cyst. States she is having suprapubic pain on the right lower side.Marland Kitchen

## 2018-01-18 DIAGNOSIS — E042 Nontoxic multinodular goiter: Secondary | ICD-10-CM | POA: Insufficient documentation

## 2018-01-18 DIAGNOSIS — K219 Gastro-esophageal reflux disease without esophagitis: Secondary | ICD-10-CM | POA: Insufficient documentation

## 2018-03-07 ENCOUNTER — Other Ambulatory Visit: Payer: Self-pay

## 2018-03-07 ENCOUNTER — Encounter
Admission: EM | Disposition: A | Discharge status: Home / Self Care | Payer: Self-pay | Source: Home / Self Care | Attending: Emergency Medicine

## 2018-03-07 ENCOUNTER — Emergency Department: Payer: 59

## 2018-03-07 ENCOUNTER — Encounter: Payer: Self-pay | Admitting: Emergency Medicine

## 2018-03-07 ENCOUNTER — Emergency Department: Payer: 59 | Admitting: Anesthesiology

## 2018-03-07 ENCOUNTER — Observation Stay
Admission: EM | Admit: 2018-03-07 | Discharge: 2018-03-08 | Disposition: A | Discharge status: Home / Self Care | Payer: 59 | Attending: Unknown Physician Specialty | Admitting: Unknown Physician Specialty

## 2018-03-07 DIAGNOSIS — S1093XA Contusion of unspecified part of neck, initial encounter: Secondary | ICD-10-CM

## 2018-03-07 DIAGNOSIS — Y836 Removal of other organ (partial) (total) as the cause of abnormal reaction of the patient, or of later complication, without mention of misadventure at the time of the procedure: Secondary | ICD-10-CM | POA: Diagnosis not present

## 2018-03-07 DIAGNOSIS — T148XXA Other injury of unspecified body region, initial encounter: Secondary | ICD-10-CM | POA: Diagnosis present

## 2018-03-07 DIAGNOSIS — E89820 Postprocedural hematoma of an endocrine system organ or structure following an endocrine system procedure: Secondary | ICD-10-CM | POA: Diagnosis present

## 2018-03-07 DIAGNOSIS — E890 Postprocedural hypothyroidism: Secondary | ICD-10-CM | POA: Diagnosis not present

## 2018-03-07 DIAGNOSIS — Z87891 Personal history of nicotine dependence: Secondary | ICD-10-CM | POA: Diagnosis not present

## 2018-03-07 HISTORY — PX: THYROIDECTOMY: SHX17

## 2018-03-07 LAB — CBC WITH DIFFERENTIAL/PLATELET
BASOS ABS: 0 10*3/uL (ref 0–0.1)
BASOS PCT: 1 %
Eosinophils Absolute: 0.1 10*3/uL (ref 0–0.7)
Eosinophils Relative: 2 %
HEMATOCRIT: 38.2 % (ref 35.0–47.0)
Hemoglobin: 12.5 g/dL (ref 12.0–16.0)
LYMPHS PCT: 32 %
Lymphs Abs: 1.5 10*3/uL (ref 1.0–3.6)
MCH: 28.3 pg (ref 26.0–34.0)
MCHC: 32.8 g/dL (ref 32.0–36.0)
MCV: 86.3 fL (ref 80.0–100.0)
Monocytes Absolute: 0.3 10*3/uL (ref 0.2–0.9)
Monocytes Relative: 6 %
NEUTROS PCT: 59 %
Neutro Abs: 2.9 10*3/uL (ref 1.4–6.5)
PLATELETS: 243 10*3/uL (ref 150–440)
RBC: 4.42 MIL/uL (ref 3.80–5.20)
RDW: 14 % (ref 11.5–14.5)
WBC: 4.8 10*3/uL (ref 3.6–11.0)

## 2018-03-07 LAB — COMPREHENSIVE METABOLIC PANEL
ALT: 15 U/L (ref 14–54)
AST: 16 U/L (ref 15–41)
Albumin: 3.6 g/dL (ref 3.5–5.0)
Alkaline Phosphatase: 50 U/L (ref 38–126)
Anion gap: 7 (ref 5–15)
BILIRUBIN TOTAL: 0.5 mg/dL (ref 0.3–1.2)
BUN: 17 mg/dL (ref 6–20)
CHLORIDE: 105 mmol/L (ref 101–111)
CO2: 27 mmol/L (ref 22–32)
CREATININE: 0.74 mg/dL (ref 0.44–1.00)
Calcium: 8.7 mg/dL — ABNORMAL LOW (ref 8.9–10.3)
GFR calc Af Amer: >60 mL/min (ref >60)
GLUCOSE: 101 mg/dL — AB (ref 65–99)
POTASSIUM: 4.1 mmol/L (ref 3.5–5.1)
Sodium: 139 mmol/L (ref 135–145)
Total Protein: 7.1 g/dL (ref 6.5–8.1)

## 2018-03-07 LAB — HCG, QUANTITATIVE, PREGNANCY

## 2018-03-07 LAB — PROTIME-INR
INR: 1.01
Prothrombin Time: 13.2 seconds (ref 11.4–15.2)

## 2018-03-07 LAB — APTT: APTT: 34 s (ref 24–36)

## 2018-03-07 SURGERY — THYROIDECTOMY
Anesthesia: General | Site: Neck | Wound class: Clean

## 2018-03-07 MED ORDER — IOPAMIDOL (ISOVUE-300) INJECTION 61%
75.0000 mL | Freq: Once | INTRAVENOUS | Status: AC | PRN
  Administered 2018-03-07: 75 mL via INTRAVENOUS

## 2018-03-07 MED ORDER — CALCIUM CARBONATE-VITAMIN D 500-200 MG-UNIT PO TABS
2.0000 | ORAL_TABLET | Freq: Two times a day (BID) | ORAL | Status: DC
  Administered 2018-03-07: 23:00:00 2 via ORAL
  Filled 2018-03-07: qty 2

## 2018-03-07 MED ORDER — ONDANSETRON HCL 4 MG/2ML IJ SOLN
INTRAMUSCULAR | Status: DC | PRN
  Administered 2018-03-07: 4 mg via INTRAVENOUS

## 2018-03-07 MED ORDER — ACETAMINOPHEN 650 MG RE SUPP: 650.0000 mg | RECTAL | Status: DC | PRN

## 2018-03-07 MED ORDER — BACITRACIN ZINC 500 UNIT/GM EX OINT
1.0000 "application " | TOPICAL_OINTMENT | Freq: Three times a day (TID) | CUTANEOUS | Status: DC
  Administered 2018-03-07 – 2018-03-08 (×2): 1 via TOPICAL
  Filled 2018-03-07: qty 28.35

## 2018-03-07 MED ORDER — HYDROCODONE-ACETAMINOPHEN 5-325 MG PO TABS
1.0000 | ORAL_TABLET | ORAL | Status: DC | PRN
  Administered 2018-03-08: 1 via ORAL
  Filled 2018-03-07: qty 1

## 2018-03-07 MED ORDER — METHIMAZOLE 10 MG PO TABS
10.0000 mg | ORAL_TABLET | Freq: Every day | ORAL | Status: DC
  Filled 2018-03-07: qty 1

## 2018-03-07 MED ORDER — PROMETHAZINE HCL 25 MG/ML IJ SOLN: 6.2500 mg | INTRAMUSCULAR | Status: DC | PRN

## 2018-03-07 MED ORDER — SUCCINYLCHOLINE CHLORIDE 20 MG/ML IJ SOLN
INTRAMUSCULAR | Status: DC | PRN
  Administered 2018-03-07: 100 mg via INTRAVENOUS

## 2018-03-07 MED ORDER — ONDANSETRON HCL 4 MG/2ML IJ SOLN: 4.0000 mg | INTRAMUSCULAR | Status: DC | PRN

## 2018-03-07 MED ORDER — FENTANYL CITRATE (PF) 100 MCG/2ML IJ SOLN
INTRAMUSCULAR | Status: AC
  Filled 2018-03-07: qty 2

## 2018-03-07 MED ORDER — ACETAMINOPHEN 160 MG/5ML PO SOLN
650.0000 mg | ORAL | Status: DC | PRN
  Filled 2018-03-07: qty 20.3

## 2018-03-07 MED ORDER — DEXAMETHASONE SODIUM PHOSPHATE 10 MG/ML IJ SOLN
10.0000 mg | Freq: Once | INTRAMUSCULAR | Status: DC
  Filled 2018-03-07: qty 1

## 2018-03-07 MED ORDER — ONDANSETRON HCL 4 MG PO TABS: 4.0000 mg | ORAL_TABLET | ORAL | Status: DC | PRN

## 2018-03-07 MED ORDER — LIDOCAINE HCL (CARDIAC) 20 MG/ML IV SOLN
INTRAVENOUS | Status: DC | PRN
  Administered 2018-03-07: 100 mg via INTRAVENOUS

## 2018-03-07 MED ORDER — LIDOCAINE HCL (PF) 2 % IJ SOLN
INTRAMUSCULAR | Status: AC
  Filled 2018-03-07: qty 10

## 2018-03-07 MED ORDER — PROPOFOL 10 MG/ML IV BOLUS
INTRAVENOUS | Status: DC | PRN
  Administered 2018-03-07: 100 mg via INTRAVENOUS
  Administered 2018-03-07: 150 mg via INTRAVENOUS

## 2018-03-07 MED ORDER — DEXTROSE-NACL 5-0.45 % IV SOLN
INTRAVENOUS | Status: DC
  Administered 2018-03-07: 23:00:00 via INTRAVENOUS

## 2018-03-07 MED ORDER — LIDOCAINE-EPINEPHRINE 1 %-1:100000 IJ SOLN
INTRAMUSCULAR | Status: DC | PRN
  Administered 2018-03-07: 3 mL

## 2018-03-07 MED ORDER — DEXAMETHASONE SODIUM PHOSPHATE 10 MG/ML IJ SOLN
10.0000 mg | Freq: Once | INTRAMUSCULAR | Status: AC
  Administered 2018-03-07: 10 mg via INTRAVENOUS

## 2018-03-07 MED ORDER — PROPOFOL 10 MG/ML IV BOLUS
INTRAVENOUS | Status: AC
  Filled 2018-03-07: qty 40

## 2018-03-07 MED ORDER — LACTATED RINGERS IV SOLN
INTRAVENOUS | Status: DC | PRN
  Administered 2018-03-07: 20:00:00 via INTRAVENOUS

## 2018-03-07 MED ORDER — LIDOCAINE-EPINEPHRINE 1 %-1:100000 IJ SOLN
INTRAMUSCULAR | Status: AC
  Filled 2018-03-07: qty 1

## 2018-03-07 MED ORDER — FENTANYL CITRATE (PF) 100 MCG/2ML IJ SOLN
25.0000 ug | INTRAMUSCULAR | Status: AC | PRN
  Administered 2018-03-07: 25 ug via INTRAVENOUS
  Administered 2018-03-07 (×2): 50 ug via INTRAVENOUS
  Administered 2018-03-07 (×3): 25 ug via INTRAVENOUS

## 2018-03-07 MED ORDER — SUCCINYLCHOLINE CHLORIDE 20 MG/ML IJ SOLN
INTRAMUSCULAR | Status: AC
  Filled 2018-03-07: qty 1

## 2018-03-07 SURGICAL SUPPLY — 39 items
BLADE SURG 15 STRL LF DISP TIS (BLADE) ×1 IMPLANT
BLADE SURG 15 STRL SS (BLADE) ×2
CANISTER SUCT 1200ML W/VALVE (MISCELLANEOUS) ×3 IMPLANT
COAGULATOR SUCT 8FR VV (MISCELLANEOUS) ×3 IMPLANT
CORD BIP STRL DISP 12FT (MISCELLANEOUS) IMPLANT
DERMABOND ADVANCED (GAUZE/BANDAGES/DRESSINGS) ×2
DERMABOND ADVANCED .7 DNX12 (GAUZE/BANDAGES/DRESSINGS) ×1 IMPLANT
DRAIN TLS ROUND 10FR (DRAIN) IMPLANT
DRAPE MAG INST 16X20 L/F (DRAPES) IMPLANT
DRSG TEGADERM 2-3/8X2-3/4 SM (GAUZE/BANDAGES/DRESSINGS) IMPLANT
ELECT LARYNGEAL 6/7 (MISCELLANEOUS)
ELECT LARYNGEAL 8/9 (MISCELLANEOUS)
ELECT REM PT RETURN 9FT ADLT (ELECTROSURGICAL) ×3
ELECTRODE LARYNGEAL 6/7 (MISCELLANEOUS) IMPLANT
ELECTRODE LARYNGEAL 8/9 (MISCELLANEOUS) IMPLANT
ELECTRODE REM PT RTRN 9FT ADLT (ELECTROSURGICAL) ×1 IMPLANT
FORCEPS JEWEL BIP 4-3/4 STR (INSTRUMENTS) IMPLANT
GLOVE BIO SURGEON STRL SZ7.5 (GLOVE) ×12 IMPLANT
GOWN STRL REUS W/ TWL LRG LVL3 (GOWN DISPOSABLE) ×2 IMPLANT
GOWN STRL REUS W/TWL LRG LVL3 (GOWN DISPOSABLE) ×4
HEMOSTAT SURGICEL 2X3 (HEMOSTASIS) ×6 IMPLANT
HOOK STAY BLUNT/RETRACTOR 5M (MISCELLANEOUS) IMPLANT
JACKSON PRATT 7MM (INSTRUMENTS) ×3 IMPLANT
KIT TURNOVER KIT A (KITS) ×3 IMPLANT
LABEL OR SOLS (LABEL) IMPLANT
NS IRRIG 500ML POUR BTL (IV SOLUTION) ×3 IMPLANT
PACK HEAD/NECK (MISCELLANEOUS) ×3 IMPLANT
PIN SAFETY STRL (MISCELLANEOUS) ×3 IMPLANT
PROBE NEUROSIGN BIPOL (MISCELLANEOUS) IMPLANT
PROBE NEUROSIGN BIPOLAR (MISCELLANEOUS)
SHEARS HARMONIC 9CM CVD (BLADE) IMPLANT
SPONGE KITTNER 5P (MISCELLANEOUS) IMPLANT
SPONGE XRAY 4X4 16PLY STRL (MISCELLANEOUS) ×3 IMPLANT
STAPLER SKIN PROX 35W (STAPLE) ×3 IMPLANT
SUT SILK 2 0 (SUTURE) ×2
SUT SILK 2 0 SH (SUTURE) ×3 IMPLANT
SUT SILK 2-0 18XBRD TIE 12 (SUTURE) ×1 IMPLANT
SUT VIC AB 4-0 RB1 18 (SUTURE) ×3 IMPLANT
SYSTEM CHEST DRAIN TLS 7FR (DRAIN) IMPLANT

## 2018-03-07 NOTE — Consult Note (Signed)
Samantha Rose, Samantha Rose [id number] 1991-08-02 [name], MD  Reason for Consult: expanding neck hematoma  HPI: 27 year old female who approximate 6 days ago underwent total thyroidectomy for Graves' disease. Had done well initially postoperatively this afternoon approximate 4 PM noticed some swelling the anterior neck.She presented to the emergency room for evaluation. Neck CT showed moderate tracheal deviation with probable hematoma collection in the thyroid bed. She is not currently having any stridor is not currently short of breath but does feel significant tightness of the anterior neck.  Personal review of the CT scan does show a probable hematoma.  Allergies: No Known Allergies  ROS: Review of systems normal other than 12 systems except per HPI.  PMH:  Past Medical History:  Diagnosis Date  . Hyperthyroidism   . Uterine fibroids affecting pregnancy, unspecified trimester     FH: History reviewed. No pertinent family history.  SH:  Social History   Socioeconomic History  . Marital status: Single    Spouse name: Not on file  . Number of children: Not on file  . Years of education: Not on file  . Highest education level: Not on file  Occupational History  . Not on file  Social Needs  . Financial resource strain: Not on file  . Food insecurity:    Worry: Not on file    Inability: Not on file  . Transportation needs:    Medical: Not on file    Non-medical: Not on file  Tobacco Use  . Smoking status: Former Smoker    Types: Cigarettes  . Smokeless tobacco: Never Used  Substance and Sexual Activity  . Alcohol use: No  . Drug use: No  . Sexual activity: Yes  Lifestyle  . Physical activity:    Days per week: Not on file    Minutes per session: Not on file  . Stress: Not on file  Relationships  . Social connections:    Talks on phone: Not on file    Gets together: Not on file    Attends religious service: Not on file    Active member of club or organization: Not on  file    Attends meetings of clubs or organizations: Not on file    Relationship status: Not on file  . Intimate partner violence:    Fear of current or ex partner: Not on file    Emotionally abused: Not on file    Physically abused: Not on file    Forced sexual activity: Not on file  Other Topics Concern  . Not on file  Social History Narrative  . Not on file    PSH:  Past Surgical History:  Procedure Laterality Date  . BREAST CYST EXCISION Right   . LAPAROSCOPY N/A 06/01/2016   Procedure: LAPAROSCOPY DIAGNOSTIC;  Surgeon: Benjaman Kindler, MD;  Location: [facility] ORS;  Service: Gynecology;  Laterality: N/A;    Physical  Exam: voice sounds normal, anterior neck with Steri-Strips in place. Obvious swelling of the anterior neck consistent with hematoma.  A/P: postoperative hematoma-I had a long discussion . I think the safest thing would be to take her to the operating room intubate secure the airway and open this neck incision and drainage of the hematoma to try and identify any bleeding spots and likely place a drain. She understands risk and benefits including bleeding infection airway obstruction and is eager to proceed.   Roena Malady 03/07/2018 7:45 PM

## 2018-03-07 NOTE — ED Notes (Addendum)
Pt signed Informed consent, placed on chart.  Given to orderly taking pt to surgery

## 2018-03-07 NOTE — Anesthesia Post-op Follow-up Note (Signed)
Anesthesia QCDR form completed.        

## 2018-03-07 NOTE — Transfer of Care (Signed)
Immediate Anesthesia Transfer of Care Note  Patient: Samantha Rose  Procedure(s) Performed: exploration of previous thyroidectomy incision, evacuation of hematoma and epistaxis of wound bed (N/A Neck)  Patient Location: PACU  Anesthesia Type:General  Level of Consciousness: awake  Airway & Oxygen Therapy: Patient Spontanous Breathing  Post-op Assessment: Report given to RN  Post vital signs: Reviewed and stable  Last Vitals:  Vitals Value Taken Time  BP 134/77 03/07/2018  9:16 PM  Temp 36.4 C 03/07/2018  9:16 PM  Pulse 88 03/07/2018  9:17 PM  Resp 18 03/07/2018  9:17 PM  SpO2 100 % 03/07/2018  9:17 PM  Vitals shown include unvalidated device data.  Last Pain:  Vitals:   03/07/18 2116  TempSrc: Tympanic  PainSc:          Complications: No apparent anesthesia complications

## 2018-03-07 NOTE — Anesthesia Preprocedure Evaluation (Signed)
Anesthesia Evaluation  Patient identified by MRN, date of birth, ID band Patient awake    Reviewed: Allergy & Precautions, NPO status , Patient's Chart, lab work & pertinent test results  History of Anesthesia Complications Negative for: history of anesthetic complications  Airway Mallampati: II       Dental  (+) Teeth Intact, Poor Dentition   Pulmonary Current Smoker, former smoker,    breath sounds clear to auscultation       Cardiovascular Exercise Tolerance: Good negative cardio ROS   Rhythm:Regular     Neuro/Psych negative neurological ROS  negative psych ROS   GI/Hepatic negative GI ROS, Neg liver ROS,   Endo/Other  negative endocrine ROSneg diabetesHypothyroidism   Renal/GU negative Renal ROS  negative genitourinary   Musculoskeletal negative musculoskeletal ROS (+)   Abdominal Normal abdominal exam  (+)   Peds negative pediatric ROS (+)  Hematology negative hematology ROS (+)   Anesthesia Other Findings Past Medical History: No date: Hyperthyroidism No date: Uterine fibroids affecting pregnancy, unspecified trimester  Patient is having a bleed s/p thyroidectomy that is putting pressure on her airway.  Patient last ate a subway sandwich at 4:00pm, but due to the situation Dr Tami Ribas has declared it a surgical emergency.  Will proceed with RSI.  Reproductive/Obstetrics negative OB ROS                             Anesthesia Physical  Anesthesia Plan  ASA: II and emergent  Anesthesia Plan: General   Post-op Pain Management:    Induction: Intravenous and Cricoid pressure planned  PONV Risk Score and Plan: 3 and Ondansetron and Dexamethasone  Airway Management Planned: Oral ETT  Additional Equipment:   Intra-op Plan:   Post-operative Plan: Extubation in OR  Informed Consent: I have reviewed the patients History and Physical, chart, labs and discussed the procedure  including the risks, benefits and alternatives for the proposed anesthesia with the patient or authorized representative who has indicated his/her understanding and acceptance.     Plan Discussed with: CRNA and Surgeon  Anesthesia Plan Comments:         Anesthesia Quick Evaluation

## 2018-03-07 NOTE — ED Notes (Signed)
Patient noticed swelling to anterior neck approx 1 hour PTA. Pt swallowing appropriately. Voice normal. Denies pain.

## 2018-03-07 NOTE — ED Notes (Signed)
Dr. Tami Ribas called to verify pt has been informed of surgery due to pt having questions. Verification confirmed. No other follow up at this time by pt.

## 2018-03-07 NOTE — ED Notes (Signed)
[place]

## 2018-03-07 NOTE — ED Notes (Signed)
Belongings placed in pt belonging by pt and pt's mother has the bag

## 2018-03-07 NOTE — Op Note (Signed)
03/07/2018  9:08 PM    Samantha Rose  [id number]   Pre-Op Dx: hematoma, status post thyroidectomy  Post-op Dx: SAME  Proc: Incision and drainage postoperative hematoma from previous thyroidectomy   Surg:  Roena Malady  Anes:  GOT  EBL:  Approximately 90 cc  Comp:  None  Findings:  Obvious arterial bleed in the subplatysmal flaps in the left corner between the strap muscle and the platysma flap. Significant collection of hematoma beneath the straps but no evidence of active bleed.  Procedure: Geraldina was identified in the emergency room and taken emergently to the operating room. After general endotracheal anesthesia the neck was gently extended. A shoulder roll was placed. The neck was prepped and draped sterilely a local anesthetic of 1% lidocaine with 1-100,000 units of epinephrine was used to inject along the previous incision site. A 15 blade was used to open the previous incision through the subcutaneous stitches and the platysma closure. There was obvious hematoma which was immediately encountered and active bleeding from beneath the flap. The strap muscle sutures were snipped in the midline and the strap muscles were retracted there was significant hematoma beneath the strap muscles which was also expressed from the wound. There was obvious active arterial bleed beneath the platysma layer in the left lateral area which was obviously pumping actively into the wound. A suction cautery was then used to cauterize this and immediately stopped all bleeding. The strap muscles were gently retracted and beginning on the left-hand side the clot was removed in its entirety. Similar fashion the clot was removed from the right-hand side. The wound was copiously irrigated with saline and suctioned free there was no obvious bleeding noted in either tracheoesophageal groove. This superior pole and inferior pole regions showed no evidence of active bleeding. The subplatysmal flap regions were  then inspected again there was no evidence of active bleeding. With all bleeding controlled Surgicel was placed in the tracheoesophageal grooves bilaterally a #7 Jackson-Pratt drain was brought out of the wound medially in place beneath the strap muscles overlying the trachea in the midline. Strap muscles were reapproximated using 4-0 Vicryl the platysma layer was closed using 4-0 Vicryl subcutaneous taste tissues closed using 4-0 Vicryl and the skin was closed using Dermabond. The Jackson-Pratt drain was sutured in position using a 2-0 silk.  Dispo:   Good  Plan:  Status post incision and drainage of postop hematoma we'll admit for overnight observation continue to monitor drain output.  Roena Malady  03/07/2018 9:08 PM

## 2018-03-07 NOTE — ED Notes (Signed)
Intubation set up and RIK at bedside in preparation for intubation if necessary. This was placed at bedside per request of Dr. [name]

## 2018-03-07 NOTE — ED Provider Notes (Signed)
[facility] Emergency Department Provider Note  ____________________________________________   First MD Initiated Contact with Patient 03/07/18 1800     (approximate)  I have reviewed the triage vital signs and the nursing notes.   HISTORY  Chief Complaint post op thyroidectomy on Wednesday, swelling in neck  Level 5 caveat:  history/ROS limited by acute/critical illness  HPI Samantha Rose is a 27 y.o. female whose medical history is most notable for total thyroidectomy 6 days ago at [facility] and presents with rapidly expanding swelling to her anterior neck at the surgical site.  She states that she has been in her normal state of health until about an hour ago when she discovered that her neck was swelling and felt tight.  She describes the symptoms as severe although she is still able to speak, brief, and swallow without difficulty.  She is concerned about the fact that she felt normal until an hour ago and she is adamant that she did not have any appreciable swelling up until that point.  She denies fever/chills, chest pain, shortness of breath, nausea, vomiting, and abdominal pain.  She had no trauma to her neck and had no moment during which she was bearing down or coughing where she felt a moment where the symptoms began.  Nothing in particular is making it better or worse.  She does not think it has gotten worse after she initially noticed it.  She says that speaking does make it feel little bit tighter but she is able to do so without difficulty.  Past Medical History:  Diagnosis Date  . Hyperthyroidism   . Uterine fibroids affecting pregnancy, unspecified trimester     Patient Active Problem List   Diagnosis Date Noted  . Leakage of amniotic fluid 04/08/2017  . Pregnancy 02/25/2017  . Indication for care in labor or delivery 02/22/2017  . Labor and delivery, indication for care 02/04/2017  . Vaginal bleeding 12/23/2016  . First trimester screening  10/07/2016    Past Surgical History:  Procedure Laterality Date  . BREAST CYST EXCISION Right   . LAPAROSCOPY N/A 06/01/2016   Procedure: LAPAROSCOPY DIAGNOSTIC;  Surgeon: Benjaman Kindler, MD;  Location: [facility] ORS;  Service: Gynecology;  Laterality: N/A;    Prior to Admission medications   Medication Sig Start Date End Date Taking? Authorizing Provider  calcium carbonate (TUMS EX) 750 MG chewable tablet Chew 2 tablets by mouth 3 (three) times daily.    Yes [provider]  cholecalciferol (VITAMIN D) 1000 units tablet Take 1,000 Units by mouth daily.   Yes [provider]  ferrous sulfate 325 (65 FE) MG tablet Take 325 mg by mouth daily with breakfast.   Yes [provider]  HYDROcodone-acetaminophen (NORCO/VICODIN) 5-325 MG tablet Take 1 tablet by mouth every 6 (six) hours as needed for moderate pain.   Yes [provider]  levonorgestrel (MIRENA) 20 MCG/24HR IUD 1 each by Intrauterine route once.   Yes [provider]  levothyroxine (SYNTHROID, LEVOTHROID) 100 MCG tablet Take 1 tablet by mouth daily. 03/02/18  Yes [provider]  methimazole (TAPAZOLE) 10 MG tablet Take 10 mg by mouth daily.    Yes [provider]  docusate sodium (COLACE) 100 MG capsule Take 1 capsule (100 mg total) by mouth daily as needed for mild constipation. Patient not taking: Reported on 03/07/2018 04/11/17   Benjaman Kindler, MD  medroxyPROGESTERone (PROVERA) 10 MG tablet Take 2 tablets (20 mg total) by mouth daily. Patient not taking:  Reported on 03/07/2018 12/01/17   Carrie Mew, MD    Allergies Patient has no known allergies.  History reviewed. No pertinent family history.  Social History Social History   Tobacco Use  . Smoking status: Former Smoker    Types: Cigarettes  . Smokeless tobacco: Never Used  Substance Use Topics  . Alcohol use: No  . Drug use: No    Review of Systems Constitutional: No fever/chills Eyes: No visual  changes. ENT: Acute onset of significant swelling to her anterior neck. Cardiovascular: Denies chest pain. Respiratory: Denies shortness of breath. Gastrointestinal: No abdominal pain.  No nausea, no vomiting.  No diarrhea.  No constipation. Genitourinary: Negative for dysuria. Musculoskeletal: Negative for neck pain.  Negative for back pain. Integumentary: Negative for rash. Neurological: Negative for headaches, focal weakness or numbness.   ____________________________________________   PHYSICAL EXAM:  VITAL SIGNS: ED Triage Vitals  Enc Vitals Group     BP 03/07/18 1748 114/82     Pulse Rate 03/07/18 1748 91     Resp 03/07/18 1748 20     Temp 03/07/18 1748 98.2 F (36.8 C)     Temp Source 03/07/18 1748 Oral     SpO2 03/07/18 1748 100 %     Weight 03/07/18 1749 68 kg (150 lb)     Height --      Head Circumference --      Peak Flow --      Pain Score 03/07/18 1748 0     Pain Loc --      Pain Edu? --      Excl. in [place]? --     Constitutional: Alert and oriented. Well appearing and in no acute distress. Eyes: Conjunctivae are normal.  Head: Atraumatic. Nose: No congestion/rhinnorhea. Mouth/Throat: Mucous membranes are moist.  No intraoral swelling appreciated.  She is tolerating secretions without difficulty. Neck: No stridor.  Patient has a very obvious large amount of swelling to her anterior neck that seems to be centered around the Steri-Strip that is still in place from her surgery.  It is tender to palpation and relatively firm but not rock hard.  It appears like a moderately sized goiter what appear in the setting of a thyroidectomy 6 days ago.  Her voice is normal. Cardiovascular: Normal rate, regular rhythm. Good peripheral circulation. Grossly normal heart sounds. Respiratory: Normal respiratory effort.  No retractions. Lungs CTAB. Gastrointestinal: Soft and nontender. No distention.  Musculoskeletal: No lower extremity tenderness nor edema. No gross deformities of  extremities. Neurologic:  Normal speech and language. No gross focal neurologic deficits are appreciated.  Skin:  Skin is warm, dry and intact. No rash noted. Psychiatric: Mood and affect are normal. Speech and behavior are normal.  ____________________________________________   LABS (all labs ordered are listed, but only abnormal results are displayed)  Labs Reviewed  COMPREHENSIVE METABOLIC PANEL - Abnormal; Notable for the following components:      Result Value   Glucose, Bld 101 (*)    Calcium 8.7 (*)    All other components within normal limits  CBC WITH DIFFERENTIAL/PLATELET  HCG, QUANTITATIVE, PREGNANCY  PROTIME-INR  APTT   ____________________________________________  EKG  None - EKG not ordered by ED physician ____________________________________________  RADIOLOGY Ursula Alert, personally viewed these images and discussed the images and results by phone with the on-call radiologist and used this discussion as part of my medical decision making.   ED MD interpretation: Soft tissue swelling and large area of swelling just left of midline  at her surgical site concerning for seroma versus hematoma resulting in right sided tracheal deviation but no tracheal impingement.  Official radiology report(s): Ct Soft Tissue Neck W Contrast  Result Date: 03/07/2018 CLINICAL DATA:  Initial evaluation for acute sore throat with neck swelling. Recent thyroidectomy. EXAM: CT NECK WITH CONTRAST TECHNIQUE: Multidetector CT imaging of the neck was performed using the standard protocol following the bolus administration of intravenous contrast. CONTRAST:  23m ISOVUE-300 IOPAMIDOL (ISOVUE-300) INJECTION 61% COMPARISON:  None. FINDINGS: Pharynx and larynx: Oral cavity within normal limits without mass lesion or loculated fluid collection. Oropharynx within normal limits. Nasopharynx normal. Mild diffusion within the lower retropharyngeal soft tissues without frank collection, likely  postoperative. Epiglottis normal. Vallecula clear. Supraglottic airway mildly narrowed but remains patent. True cords symmetric and normal. Subglottic airway is deviated to the right but remains patent. Salivary glands: Salivary glands including the parotid glands and submandibular glands within normal limits. Thyroid: Patient status post thyroidectomy. There is ill-defined heterogeneous hyperdensity and fluid within the thyroid bed, concerning for postoperative hematoma. Collection measures approximately 5.8 x 8.4 x 7.6 cm, although exact measurements somewhat difficult given the somewhat ill-defined nature of this collection. Scattered areas of mildly increased attenuation seen within this collection, most consistent with blood products. Low-density this collection could be related to subacute hematoma, although a hyperacute rapidly expanding hematoma could also conceivably have this appearance. While no definite active arterial bleeding is identified, active hemorrhage could be difficult to discern in the setting of a rapidly expansile hematoma. Particularly, there is a small focus of hemorrhage seen on axial image 68 which is mildly prominent (series 3, image 68). Possible small bleed at this level would be difficult to exclude. Lymph nodes: No pathologically enlarged lymph nodes identified within the neck. Vascular: Note again made of mildly prominent hemorrhage on axial image 68 at the level of the thyroidectomy, favored to reflect hematoma, although a small actively would be difficult to exclude. No other evidence for active contrast extravasation. Normal intravascular enhancement seen throughout the remainder the neck. Limited intracranial: Unremarkable. Visualized orbits: Visualized globes and orbital soft tissues within normal limits. Mastoids and visualized paranasal sinuses: Visualized paranasal sinuses are clear. Visualize mastoids and middle ear cavities are clear. Skeleton: No acute osseus  abnormality. No worrisome lytic or blastic osseous lesions. Mild degenerate spondylolysis noted at C5-6. Upper chest: Visualized upper chest within normal limits. Visualized lungs are clear. Other: None. IMPRESSION: Approximate 5.8 x 8.4 x 7.6 cm heterogeneous collection at the site of recent thyroidectomy, most consistent with hematoma. Mild mass effect on the subglottic trachea which is displaced to the right but remains patent at this time. No definite active contrast extravasation seen at this time (see above discussion). Close clinical monitoring recommended as airway compromise could quickly developed in the setting of an expanding hematoma. Critical Value/emergent results were called by telephone at the time of interpretation on 03/07/2018 at 6:33 pm to Dr. CHinda Kehr, who verbally acknowledged these results. Electronically Signed   By: BJeannine BogaM.D.   On: 03/07/2018 19:14    ____________________________________________   PROCEDURES  Critical Care performed: Yes, see critical care procedure note(s)   Procedure(s) performed:   .Critical Care Performed by: FHinda Kehr MD Authorized by: FHinda Kehr MD   Critical care provider statement:    Critical care time (minutes):  30   Critical care time was exclusive of:  Separately billable procedures and treating other patients   Critical care was necessary to treat or prevent  imminent or life-threatening deterioration of the following conditions: airway compromise.   Critical care was time spent personally by me on the following activities:  Development of treatment plan with patient or surrogate, discussions with consultants, evaluation of patient's response to treatment, examination of patient, obtaining history from patient or surrogate, ordering and performing treatments and interventions, ordering and review of laboratory studies, ordering and review of radiographic studies, pulse oximetry, re-evaluation of patient's  condition and review of old charts     ____________________________________________   INITIAL IMPRESSION / [place] / ED COURSE  As part of my medical decision making, I reviewed the following data within the [place] notes reviewed and incorporated, Labs reviewed , Discussed with admitting physician  and A consult was requested and obtained from this/these consultant(s) ENT    Differential diagnosis includes, but is not limited to, rapidly expanding neck hematoma from either venous or arterial injury, abscess, seroma.  Obviously the largest concern is loss of airway and given that she reports the symptoms started within the last hour, even though it is 6 days after her surgery, I am very concerned about airway compromise.  I decided that I would send her immediately to CT scan to try to visualize as best as possible her neck and any airway compromise into better assess the nature of the swelling.  I gathered together emergency airway equipment in the meantime and will reassess her immediately as well as reach out to my ENT surgeon to discuss the case.  I explained to the patient that we may need to act immediately and aggressively to protect her airway and she understands and agrees.  Clinical Course as of Mar 07 2036  Tue Mar 07, 2018  7035 (Delayed documentation) while the patient was in the CT scanner, I called Dr. Tami Ribas.  We discussed the case extensively by phone and I interpreted for him the CT images I was reviewing, which suggests hematoma but with no active extravasation.  He recommended that if the patient is actively getting worse that I open the incision and explore with hemostats in order to drain the hematoma and relieve internal pressure, then apply external pressure for the bleeding and call him back.I evaluated the patient as soon as she came back from CT and she is stable and states subjectively that it does not seem to be getting worse.  I  then discussed the case by phone with radiology, Dr. Jeannine Boga, who agreed with the assessment that this appears to be a subacute extravasation of blood but without any active bleeding and with accompanying soft tissue edema.  I have ordered Decadron 10 mg IV, standard blood work, and I have spoken with the [facility] transfer center to discuss the case by phone with general surgery to discuss the need for transfer back to their facility.  The patient remained stable at this time but I have advanced airway equipment ready including Glidescope and fiberoptic intubating scope as well as an I&D kit if needed to open the wound.   [CF]  0093 spoke with Dr. Cena Benton and we discussed the case in detail.  He feels strongly that the patient is not appropriate for transfer given the possibility of losing her airway, and I understand and agree with this assessment.  I explained I had already spoken with my ENT surgeon who recommended that I talk to him before we perform any definitive management but I would be happy to call back to Dr. Tami Ribas discussed  the case again. Dr. Cena Benton felt that this was the most appropriate result for the patient to keep her at [facility] given the possibility of loss of airway during transfer and the much better scenario of taking her to the operating room to drain the hematoma in a controlled setting.  I understand and agree with this plan and will contact Dr. Tami Ribas again.   [CF]  1902 Spoke with Dr. Tami Ribas and let him know about my phone conversation with Dr. Cena Benton.  Dr. Tami Ribas is on his way in to evaluate the patient in person.  I updated her and she is comfortable with this plan to stay at [facility] and not be transferred to [facility].  Her neck swelling has not further expanded and she is still protecting her airway.   [CF]  1927 Dr. Tami Ribas evaluated the patient in person and discussed with me in person.  He is going to admit her to the operating room for surgical evacuation of the hematoma vs seroma    [CF]    Clinical Course User Index [CF] [name], MD    ____________________________________________  FINAL CLINICAL IMPRESSION(S) / ED DIAGNOSES  Final diagnoses:  Hematoma of neck, initial encounter     MEDICATIONS GIVEN DURING THIS VISIT:  Medications  fentaNYL (SUBLIMAZE) injection 25-50 mcg (50 mcg Intravenous Given 03/07/18 2025)  promethazine (PHENERGAN) injection 6.25-12.5 mg (has no administration in time range)  iopamidol (ISOVUE-300) 61 % injection 75 mL (75 mLs Intravenous Contrast Given 03/07/18 1813)  dexamethasone (DECADRON) injection 10 mg (10 mg Intravenous Given 03/07/18 1832)     ED Discharge Orders    None       Note:  This document was prepared using Dragon voice recognition software and may include unintentional dictation errors.    [name], MD 03/07/18 2037

## 2018-03-07 NOTE — ED Notes (Signed)
ED Provider at bedside. 

## 2018-03-07 NOTE — ED Triage Notes (Addendum)
Pt to ed with c/o throat and neck swelling acute onset 1 hour pta. Pt states she had thyroidectomy at [place] on Wednesday and throat/neck began to swell today.  Pt with large amount of swelling noted to anterior neck.  Pt states her throat feels tight, worse with vocalization.  Pt denies difficulty breathing at this time.

## 2018-03-07 NOTE — Anesthesia Procedure Notes (Signed)
Procedure Name: Intubation Date/Time: 03/07/2018 8:22 PM Performed by: Geraldine Contras, CRNA Pre-anesthesia Checklist: Patient identified, Emergency Drugs available, Suction available, Patient being monitored and Timeout performed Patient Re-evaluated:Patient Re-evaluated prior to induction Oxygen Delivery Method: Circle system utilized Preoxygenation: Pre-oxygenation with 100% oxygen Induction Type: IV induction, Rapid sequence and Cricoid Pressure applied Laryngoscope Size: McGraph and 3 Grade View: Grade I Tube size: 7.0 mm Number of attempts: 1 Airway Equipment and Method: Stylet and Video-laryngoscopy Placement Confirmation: ETT inserted through vocal cords under direct vision,  positive ETCO2 and breath sounds checked- equal and bilateral Secured at: 22 cm Tube secured with: Tape Dental Injury: Teeth and Oropharynx as per pre-operative assessment

## 2018-03-07 NOTE — ED Notes (Signed)
[facility]  CALLED PER  DR  [facility] MD

## 2018-03-08 ENCOUNTER — Encounter: Payer: Self-pay | Admitting: Unknown Physician Specialty

## 2018-03-08 DIAGNOSIS — E89820 Postprocedural hematoma of an endocrine system organ or structure following an endocrine system procedure: Secondary | ICD-10-CM | POA: Diagnosis not present

## 2018-03-08 MED ORDER — LEVOTHYROXINE SODIUM 100 MCG PO TABS
100.0000 ug | ORAL_TABLET | Freq: Every day | ORAL | Status: DC
  Administered 2018-03-08: 100 ug via ORAL
  Filled 2018-03-08: qty 1

## 2018-03-08 NOTE — Discharge Summary (Signed)
03/08/2018 7:58 AM  Samantha Rose [id number]  Post-Op Day 1    Temp:  [97 F (36.1 C)-98.3 F (36.8 C)] 97.4 F (36.3 C) (03/27 0442) Pulse Rate:  [67-96] 67 (03/27 0442) Resp:  [12-25] 18 (03/27 0442) BP: (106-134)/(61-82) 111/72 (03/27 0442) SpO2:  [95 %-100 %] 99 % (03/27 0442) Weight:  [68 kg (150 lb)] 68 kg (150 lb) (03/26 1749),     Intake/Output Summary (Last 24 hours) at 03/08/2018 0758 Last data filed at 03/08/2018 0520 Gross per 24 hour  Intake 819 ml  Output 995 ml  Net -176 ml    Results for orders placed or performed during the hospital encounter of 03/07/18 (from the past 24 hour(s))  CBC with Differential/Platelet     Status: None   Collection Time: 03/07/18  6:29 PM  Result Value Ref Range   WBC 4.8 3.6 - 11.0 K/uL   RBC 4.42 3.80 - 5.20 MIL/uL   Hemoglobin 12.5 12.0 - 16.0 g/dL   HCT 38.2 35.0 - 47.0 %   MCV 86.3 80.0 - 100.0 fL   MCH 28.3 26.0 - 34.0 pg   MCHC 32.8 32.0 - 36.0 g/dL   RDW 14.0 11.5 - 14.5 %   Platelets 243 150 - 440 K/uL   Neutrophils Relative % 59 %   Neutro Abs 2.9 1.4 - 6.5 K/uL   Lymphocytes Relative 32 %   Lymphs Abs 1.5 1.0 - 3.6 K/uL   Monocytes Relative 6 %   Monocytes Absolute 0.3 0.2 - 0.9 K/uL   Eosinophils Relative 2 %   Eosinophils Absolute 0.1 0 - 0.7 K/uL   Basophils Relative 1 %   Basophils Absolute 0.0 0 - 0.1 K/uL  Comprehensive metabolic panel     Status: Abnormal   Collection Time: 03/07/18  6:29 PM  Result Value Ref Range   Sodium 139 135 - 145 mmol/L   Potassium 4.1 3.5 - 5.1 mmol/L   Chloride 105 101 - 111 mmol/L   CO2 27 22 - 32 mmol/L   Glucose, Bld 101 (H) 65 - 99 mg/dL   BUN 17 6 - 20 mg/dL   Creatinine, Ser 0.74 0.44 - 1.00 mg/dL   Calcium 8.7 (L) 8.9 - 10.3 mg/dL   Total Protein 7.1 6.5 - 8.1 g/dL   Albumin 3.6 3.5 - 5.0 g/dL   AST 16 15 - 41 U/L   ALT 15 14 - 54 U/L   Alkaline Phosphatase 50 38 - 126 U/L   Total Bilirubin 0.5 0.3 - 1.2 mg/dL   GFR calc non Af Amer >60 >60 mL/min   GFR  calc Af Amer >60 >60 mL/min   Anion gap 7 5 - 15  hCG, quantitative, pregnancy     Status: None   Collection Time: 03/07/18  6:29 PM  Result Value Ref Range   hCG, Beta Chain, Quant, S <1 <5 mIU/mL  Protime-INR     Status: None   Collection Time: 03/07/18  6:29 PM  Result Value Ref Range   Prothrombin Time 13.2 11.4 - 15.2 seconds   INR 1.01   APTT     Status: None   Collection Time: 03/07/18  6:29 PM  Result Value Ref Range   aPTT 34 24 - 36 seconds    SUBJECTIVE:  Feeling much better.  Neck tightness resolved.  OBJECTIVE:  No evidence of hematoma.  Wound looks clean and dry  IMPRESSION:  S/p total thyroidectomy with post-op hematoma.  No evidence of hematoma today.  PLAN:  DC to home.  She has appointment with her Surgeon at [facility].  I will leave the drain another 24hrs to be sure no reaccumulation. She knows how to reset the drain and record.  She can continue her followup with [facility] surgery as scheduled and with me PRN.    Roena Malady 03/08/2018, 7:58 AM

## 2018-03-08 NOTE — Progress Notes (Signed)
IV was removed. Discharge instructions, follow-up appointments, and prescriptions were provided to the pt. All questions answered. The pt was taken downstairs via wheelchair by volunteers.  

## 2018-03-09 NOTE — Anesthesia Postprocedure Evaluation (Signed)
Anesthesia Post Note  Patient: Samantha Rose  Procedure(s) Performed: exploration of previous thyroidectomy incision, evacuation of hematoma and epistaxis of wound bed (N/A Neck)  Patient location during evaluation: PACU Anesthesia Type: General Level of consciousness: awake and alert Pain management: pain level controlled Vital Signs Assessment: post-procedure vital signs reviewed and stable Respiratory status: spontaneous breathing, nonlabored ventilation, respiratory function stable and patient connected to nasal cannula oxygen Cardiovascular status: blood pressure returned to baseline and stable Postop Assessment: no apparent nausea or vomiting Anesthetic complications: no     Last Vitals:  Vitals:   03/07/18 2344 03/08/18 0442  BP: 111/63 111/72  Pulse: 74 67  Resp:  18  Temp: 36.8 C (!) 36.3 C  SpO2: 100% 99%    Last Pain:  Vitals:   03/08/18 0700  TempSrc:   PainSc: 0-No pain                 Martha Clan

## 2018-08-29 ENCOUNTER — Encounter: Payer: Self-pay | Admitting: Emergency Medicine

## 2018-08-29 ENCOUNTER — Emergency Department
Admission: EM | Admit: 2018-08-29 | Discharge: 2018-08-29 | Disposition: A | Discharge status: Home / Self Care | Payer: 59 | Attending: Emergency Medicine | Admitting: Emergency Medicine

## 2018-08-29 ENCOUNTER — Emergency Department: Payer: 59

## 2018-08-29 DIAGNOSIS — E0590 Thyrotoxicosis, unspecified without thyrotoxic crisis or storm: Secondary | ICD-10-CM | POA: Diagnosis not present

## 2018-08-29 DIAGNOSIS — Z87891 Personal history of nicotine dependence: Secondary | ICD-10-CM | POA: Diagnosis not present

## 2018-08-29 DIAGNOSIS — R1011 Right upper quadrant pain: Secondary | ICD-10-CM | POA: Diagnosis not present

## 2018-08-29 DIAGNOSIS — R1010 Upper abdominal pain, unspecified: Secondary | ICD-10-CM

## 2018-08-29 DIAGNOSIS — D259 Leiomyoma of uterus, unspecified: Secondary | ICD-10-CM | POA: Diagnosis not present

## 2018-08-29 LAB — HEPATIC FUNCTION PANEL
ALT: 18 U/L (ref 0–44)
AST: 19 U/L (ref 15–41)
Albumin: 3.9 g/dL (ref 3.5–5.0)
Alkaline Phosphatase: 51 U/L (ref 38–126)
BILIRUBIN TOTAL: 0.6 mg/dL (ref 0.3–1.2)
Total Protein: 7.8 g/dL (ref 6.5–8.1)

## 2018-08-29 LAB — URINALYSIS, COMPLETE (UACMP) WITH MICROSCOPIC
BACTERIA UA: NONE SEEN
RBC / HPF: >50 RBC/hpf — ABNORMAL HIGH (ref 0–5)
Specific Gravity, Urine: 1.026 (ref 1.005–1.030)
WBC UA: NONE SEEN WBC/hpf (ref 0–5)

## 2018-08-29 LAB — BASIC METABOLIC PANEL
Anion gap: 8 (ref 5–15)
BUN: 12 mg/dL (ref 6–20)
CHLORIDE: 105 mmol/L (ref 98–111)
CO2: 27 mmol/L (ref 22–32)
CREATININE: 0.73 mg/dL (ref 0.44–1.00)
Calcium: 8.9 mg/dL (ref 8.9–10.3)
GFR calc Af Amer: >60 mL/min (ref >60)
GLUCOSE: 90 mg/dL (ref 70–99)
POTASSIUM: 3.9 mmol/L (ref 3.5–5.1)
SODIUM: 140 mmol/L (ref 135–145)

## 2018-08-29 LAB — CBC
HEMATOCRIT: 32.6 % — AB (ref 35.0–47.0)
Hemoglobin: 11.3 g/dL — ABNORMAL LOW (ref 12.0–16.0)
MCH: 30.8 pg (ref 26.0–34.0)
MCHC: 34.7 g/dL (ref 32.0–36.0)
MCV: 88.9 fL (ref 80.0–100.0)
PLATELETS: 273 10*3/uL (ref 150–440)
RBC: 3.67 MIL/uL — ABNORMAL LOW (ref 3.80–5.20)
RDW: 14.7 % — AB (ref 11.5–14.5)
WBC: 5.3 10*3/uL (ref 3.6–11.0)

## 2018-08-29 LAB — POCT PREGNANCY, URINE: PREG TEST UR: NEGATIVE

## 2018-08-29 LAB — LIPASE, BLOOD: LIPASE: 23 U/L (ref 11–51)

## 2018-08-29 MED ORDER — FAMOTIDINE 20 MG PO TABS: 20.0000 mg | ORAL_TABLET | Freq: Two times a day (BID) | ORAL | 1 refills | Status: AC

## 2018-08-29 MED ORDER — ONDANSETRON HCL 4 MG/2ML IJ SOLN
4.0000 mg | Freq: Once | INTRAMUSCULAR | Status: AC
  Administered 2018-08-29: 4 mg via INTRAVENOUS
  Filled 2018-08-29: qty 2

## 2018-08-29 MED ORDER — FAMOTIDINE 20 MG PO TABS
20.0000 mg | ORAL_TABLET | Freq: Once | ORAL | Status: AC
  Administered 2018-08-29: 20 mg via ORAL
  Filled 2018-08-29: qty 1

## 2018-08-29 MED ORDER — TRAMADOL HCL 50 MG PO TABS: 50.0000 mg | ORAL_TABLET | Freq: Four times a day (QID) | ORAL | 0 refills | Status: AC | PRN

## 2018-08-29 MED ORDER — MORPHINE SULFATE (PF) 4 MG/ML IV SOLN
4.0000 mg | Freq: Once | INTRAVENOUS | Status: AC
  Administered 2018-08-29: 4 mg via INTRAVENOUS
  Filled 2018-08-29: qty 1

## 2018-08-29 NOTE — ED Notes (Signed)
UA clean catch collected and sent to lab. Urine red colored. Pt states she is currently menstruating.

## 2018-08-29 NOTE — ED Triage Notes (Signed)
Pt reports pain to her right flank for the past 3 days. Pt reports pain is a dull pain that stays and gets worse at night when she lays down. Pt denies pain that radiates, urinary sx;s, or other symptoms.

## 2018-08-29 NOTE — ED Provider Notes (Signed)
[facility] Emergency Department Provider Note       Time seen: ----------------------------------------- 9:10 AM on 08/29/2018 -----------------------------------------   I have reviewed the triage vital signs and the nursing notes.  HISTORY   Chief Complaint Flank Pain    HPI Samantha Rose is a 27 y.o. female with a history of hyperthyroidism, uterine fibroids who presents to the ED for right upper quadrant pain for the past 3 days.  Patient reports pain is dull and intermittently sharp.  Pain is worse when she lays down.  There is no radiation of the pain, she denies fevers, chills, vomiting or diarrhea.  Past Medical History:  Diagnosis Date  . Hyperthyroidism   . Uterine fibroids affecting pregnancy, unspecified trimester     Patient Active Problem List   Diagnosis Date Noted  . Hematoma 03/07/2018  . Leakage of amniotic fluid 04/08/2017  . Pregnancy 02/25/2017  . Indication for care in labor or delivery 02/22/2017  . Labor and delivery, indication for care 02/04/2017  . Vaginal bleeding 12/23/2016  . First trimester screening 10/07/2016    Past Surgical History:  Procedure Laterality Date  . BREAST CYST EXCISION Right   . LAPAROSCOPY N/A 06/01/2016   Procedure: LAPAROSCOPY DIAGNOSTIC;  Surgeon: Benjaman Kindler, MD;  Location: [facility] ORS;  Service: Gynecology;  Laterality: N/A;  . THYROIDECTOMY N/A 03/07/2018   Procedure: exploration of previous thyroidectomy incision, evacuation of hematoma and epistaxis of wound bed;  Surgeon: Beverly Gust, MD;  Location: [facility] ORS;  Service: ENT;  Laterality: N/A;    Allergies Patient has no known allergies.  Social History Social History   Tobacco Use  . Smoking status: Former Smoker    Types: Cigarettes  . Smokeless tobacco: Never Used  Substance Use Topics  . Alcohol use: No  . Drug use: No   Review of Systems Constitutional: Negative for fever. Cardiovascular: Negative for chest  pain. Respiratory: Negative for shortness of breath. Gastrointestinal: Positive for abdominal pain Musculoskeletal: Negative for back pain. Skin: Negative for rash. Neurological: Negative for headaches, focal weakness or numbness.  All systems negative/normal/unremarkable except as stated in the HPI  ____________________________________________   PHYSICAL EXAM:  VITAL SIGNS: ED Triage Vitals [08/29/18 0858]  Enc Vitals Group     BP 113/78     Pulse Rate 83     Resp 20     Temp 98.6 F (37 C)     Temp Source Oral     SpO2 100 %     Weight 150 lb (68 kg)     Height 5\' 4"  (1.626 m)     Head Circumference      Peak Flow      Pain Score 7     Pain Loc      Pain Edu?      Excl. in [place]?    Constitutional: Alert and oriented. Well appearing and in no distress. Eyes: Conjunctivae are normal. Normal extraocular movements. ENT   Head: Normocephalic and atraumatic.   Nose: No congestion/rhinnorhea.   Mouth/Throat: Mucous membranes are moist.   Neck: No stridor. Cardiovascular: Normal rate, regular rhythm. No murmurs, rubs, or gallops. Respiratory: Normal respiratory effort without tachypnea nor retractions. Breath sounds are clear and equal bilaterally. No wheezes/rales/rhonchi. Gastrointestinal: Right upper quadrant tenderness with Murphy sign, normal bowel sounds Musculoskeletal: Nontender with normal range of motion in extremities. No lower extremity tenderness nor edema. Neurologic:  Normal speech and language. No gross focal neurologic deficits are appreciated.  Skin:  Skin  is warm, dry and intact. No rash noted. ____________________________________________  ED COURSE:  As part of my medical decision making, I reviewed the following data within the [place] History obtained from family if available, nursing notes, old chart and ekg, as well as notes from prior ED visits. Patient presented for right upper quadrant pain, we will assess with labs  and imaging as indicated at this time.   Procedures ____________________________________________   LABS (pertinent positives/negatives)  Labs Reviewed  URINALYSIS, COMPLETE (UACMP) WITH MICROSCOPIC - Abnormal; Notable for the following components:      Result Value   Color, Urine RED (*)    APPearance TURBID (*)    Glucose, UA   (*)    Value: TEST NOT REPORTED DUE TO COLOR INTERFERENCE OF URINE PIGMENT   Hgb urine dipstick   (*)    Value: TEST NOT REPORTED DUE TO COLOR INTERFERENCE OF URINE PIGMENT   Bilirubin Urine   (*)    Value: TEST NOT REPORTED DUE TO COLOR INTERFERENCE OF URINE PIGMENT   Ketones, ur   (*)    Value: TEST NOT REPORTED DUE TO COLOR INTERFERENCE OF URINE PIGMENT   Protein, ur   (*)    Value: TEST NOT REPORTED DUE TO COLOR INTERFERENCE OF URINE PIGMENT   Nitrite   (*)    Value: TEST NOT REPORTED DUE TO COLOR INTERFERENCE OF URINE PIGMENT   Leukocytes, UA   (*)    Value: TEST NOT REPORTED DUE TO COLOR INTERFERENCE OF URINE PIGMENT   RBC / HPF >50 (*)    All other components within normal limits  CBC - Abnormal; Notable for the following components:   RBC 3.67 (*)    Hemoglobin 11.3 (*)    HCT 32.6 (*)    RDW 14.7 (*)    All other components within normal limits  BASIC METABOLIC PANEL  HEPATIC FUNCTION PANEL  LIPASE, BLOOD  POC URINE PREG, ED  POCT PREGNANCY, URINE    RADIOLOGY Images were viewed by me  Right upper quadrant ultrasound IMPRESSION: Normal right upper quadrant ultrasound. ____________________________________________  DIFFERENTIAL DIAGNOSIS   Biliary colic, UTI, cholecystitis, pyelonephritis, gas pain, constipation, hepatitis, GERD, peptic ulcer disease  FINAL ASSESSMENT AND PLAN  Abdominal pain   Plan: The patient had presented for right upper quadrant pain. Patient's labs did not reveal any acute process. Patient's imaging was surprisingly normal.  She will be discharged with antacids and pain medicine and referred to primary  care doctor for outpatient follow-up.   Laurence Aly, MD   Note: This note was generated in part or whole with voice recognition software. Voice recognition is usually quite accurate but there are transcription errors that can and very often do occur. I apologize for any typographical errors that were not detected and corrected.     Earleen Newport, MD 08/29/18 [phone]

## 2020-03-11 ENCOUNTER — Encounter: Payer: Self-pay | Admitting: Advanced Practice Midwife

## 2020-03-11 ENCOUNTER — Other Ambulatory Visit (HOSPITAL_COMMUNITY)
Admission: RE | Admit: 2020-03-11 | Discharge: 2020-03-11 | Disposition: A | Discharge status: Home / Self Care | Payer: Medicaid Other | Source: Ambulatory Visit | Attending: Advanced Practice Midwife | Admitting: Advanced Practice Midwife

## 2020-03-11 ENCOUNTER — Ambulatory Visit (INDEPENDENT_AMBULATORY_CARE_PROVIDER_SITE_OTHER): Payer: 59 | Admitting: Advanced Practice Midwife

## 2020-03-11 ENCOUNTER — Other Ambulatory Visit: Payer: Self-pay

## 2020-03-11 VITALS — BP 130/88 | HR 72 | Ht 64.0 in | Wt 161.0 lb

## 2020-03-11 DIAGNOSIS — Z113 Encounter for screening for infections with a predominantly sexual mode of transmission: Secondary | ICD-10-CM | POA: Diagnosis not present

## 2020-03-11 DIAGNOSIS — Z01419 Encounter for gynecological examination (general) (routine) without abnormal findings: Secondary | ICD-10-CM | POA: Insufficient documentation

## 2020-03-11 DIAGNOSIS — Z124 Encounter for screening for malignant neoplasm of cervix: Secondary | ICD-10-CM | POA: Diagnosis present

## 2020-03-11 DIAGNOSIS — Z30011 Encounter for initial prescription of contraceptive pills: Secondary | ICD-10-CM

## 2020-03-11 MED ORDER — NORETHINDRONE 0.35 MG PO TABS: 1.0000 | ORAL_TABLET | Freq: Every day | ORAL | 4 refills | Status: AC

## 2020-03-11 NOTE — Progress Notes (Signed)
Gynecology Annual Exam   Date of Service: 03/11/2020  PCP: Glendon Axe, MD  Chief Complaint:  Chief Complaint  Patient presents with  . Gynecologic Exam    requesting labwork    History of Present Illness: Patient is a 29 y.o. G2P1011 presents for annual exam. The patient has no complaints today. The patient thinks she had a PAP smear in the last 4 years- most likely at [facility]. There is no record of PAP smear in her chart.   LMP: Patient's last menstrual period was 03/07/2020 (exact date). Average Interval: regular, 28 days Duration of flow: 7 days Heavy Menses: varies Clots: no Intermenstrual Bleeding: no Postcoital Bleeding: no Dysmenorrhea: no  The patient is sexually active. She currently uses OCP (estrogen/progesterone) for contraception. She denies dyspareunia.  The patient does perform self breast exams.  There is no notable family history of breast or ovarian cancer in her family. Her maternal grandmother was diagnosed with breast cancer in her 63s. The patient had a breast  Lumpectomy for benign tumor in 2009.  The patient wears seatbelts: yes.   The patient has regular exercise: she is active at work and with her baby. She tries to eat a healthy diet. She admits adequate hydration and sleep.    The patient denies current symptoms of depression.    Patient is a daily smoker- 4 cpd. We discussed switching to POP for birth control due to increased risk of stroke/cardiovascular disease from combination of OCP/smoking. She understands the importance of taking the POP at the same time daily and never missing a dose for optimal effectiveness.  She has a history of total thyroidectomy and her PCP follows her thyroid lab and medication needs.   Review of Systems: Review of Systems  Constitutional: Negative.   HENT: Negative.   Eyes: Negative.   Respiratory: Negative.   Cardiovascular: Negative.   Gastrointestinal: Negative.   Genitourinary: Negative.     Musculoskeletal: Negative.   Skin: Negative.   Neurological: Negative.   Endo/Heme/Allergies: Negative.   Psychiatric/Behavioral: Negative.     Past Medical History:  Patient Active Problem List   Diagnosis Date Noted  . Multinodular goiter 01/18/2018  . Gastroesophageal reflux disease without esophagitis 01/18/2018    Past Surgical History:  Past Surgical History:  Procedure Laterality Date  . BREAST CYST EXCISION Right   . LAPAROSCOPY N/A 06/01/2016   Procedure: LAPAROSCOPY DIAGNOSTIC;  Surgeon: Benjaman Kindler, MD;  Location: [facility] ORS;  Service: Gynecology;  Laterality: N/A;  . THYROIDECTOMY N/A 03/07/2018   Procedure: exploration of previous thyroidectomy incision, evacuation of hematoma and epistaxis of wound bed;  Surgeon: Beverly Gust, MD;  Location: [facility] ORS;  Service: ENT;  Laterality: N/A;    Gynecologic History:  Patient's last menstrual period was 03/07/2020 (exact date). Contraception: OCP (estrogen/progesterone) Last Pap: unknown Results were: patient denies abnormal history   Obstetric History: G2P1011  Family History:  Family History  Problem Relation Age of Onset  . Breast cancer Maternal Grandmother 62    Social History:  Social History   Socioeconomic History  . Marital status: Single    Spouse name: Not on file  . Number of children: Not on file  . Years of education: Not on file  . Highest education level: Not on file  Occupational History  . Not on file  Tobacco Use  . Smoking status: Current Every Day Smoker    Types: Cigarettes  . Smokeless tobacco: Never Used  Substance and Sexual Activity  .  Alcohol use: No  . Drug use: No  . Sexual activity: Yes    Birth control/protection: Pill  Other Topics Concern  . Not on file  Social History Narrative  . Not on file   Social Determinants of Health   Financial Resource Strain:   . Difficulty of Paying Living Expenses:   Food Insecurity:   . Worried About Charity fundraiser in the  Last Year:   . Arboriculturist in the Last Year:   Transportation Needs:   . Film/video editor (Medical):   Marland Kitchen Lack of Transportation (Non-Medical):   Physical Activity:   . Days of Exercise per Week:   . Minutes of Exercise per Session:   Stress:   . Feeling of Stress :   Social Connections:   . Frequency of Communication with Friends and Family:   . Frequency of Social Gatherings with Friends and Family:   . Attends Religious Services:   . Active Member of Clubs or Organizations:   . Attends Archivist Meetings:   Marland Kitchen Marital Status:   Intimate Partner Violence:   . Fear of Current or Ex-Partner:   . Emotionally Abused:   Marland Kitchen Physically Abused:   . Sexually Abused:     Allergies:  No Known Allergies  Medications: Prior to Admission medications   Medication Sig Start Date End Date Taking? Authorizing Provider  cholecalciferol (VITAMIN D) 1000 units tablet Take 1,000 Units by mouth daily.   Yes [provider]  famotidine (PEPCID) 20 MG tablet Take 1 tablet (20 mg total) by mouth 2 (two) times daily. 08/29/18  Yes Earleen Newport, MD  ferrous sulfate 325 (65 FE) MG tablet Take 325 mg by mouth daily with breakfast.   Yes [provider]  levothyroxine (SYNTHROID) 125 MCG tablet Take 125 mcg by mouth daily.   Yes [provider]  norethindrone (MICRONOR) 0.35 MG tablet Take 1 tablet (0.35 mg total) by mouth daily. 03/11/20   Rod Can, CNM    Physical Exam Vitals: Blood pressure 130/88, pulse 72, height 5\' 4"  (1.626 m), weight 161 lb (73 kg), last menstrual period 03/07/2020  General: NAD HEENT: normocephalic, anicteric Thyroid: no enlargement, no palpable nodules Pulmonary: No increased work of breathing, CTAB Cardiovascular: RRR, distal pulses 2+ Breast: Breast symmetrical, no tenderness, no palpable nodules or masses, no skin or nipple retraction present, no nipple discharge.  No axillary or supraclavicular  lymphadenopathy. Abdomen: NABS, soft, non-tender, non-distended.  Umbilicus without lesions.  No hepatomegaly, splenomegaly or masses palpable. No evidence of hernia  Genitourinary:  External: Normal external female genitalia.  Normal urethral meatus, normal Bartholin's and Skene's glands.    Vagina: Normal vaginal mucosa, no evidence of prolapse.    Cervix: Grossly normal in appearance, no bleeding, no CMT  Uterus: Non-enlarged, mobile, normal contour.    Adnexa: ovaries non-enlarged, no adnexal masses  Rectal: deferred  Lymphatic: no evidence of inguinal lymphadenopathy Extremities: no edema, erythema, or tenderness Neurologic: Grossly intact Psychiatric: mood appropriate, affect full    Assessment: 29 y.o. G2P1011 routine annual exam  Plan: Problem List Items Addressed This Visit    None    Visit Diagnoses    Well woman exam with routine gynecological exam    -  Primary   Relevant Orders   Cytology - PAP   HIV Antibody (routine testing w rflx)   RPR Qual   Hepatitis B surface antigen   Encounter for initial prescription of contraceptive pills  Relevant Medications   norethindrone (MICRONOR) 0.35 MG tablet   Screen for sexually transmitted diseases       Relevant Orders   Cytology - PAP   HIV Antibody (routine testing w rflx)   RPR Qual   Hepatitis B surface antigen   Cervical cancer screening       Relevant Orders   Cytology - PAP      1) STI screening  was offered and accepted  2)  ASCCP guidelines and rationale discussed.  Patient opts for every 3 years screening interval. PAP smear today.  3) Contraception - the patient is currently using  OCP (estrogen/progesterone).  She is interested in changing to POP to reduce cardiovascular risk.  4) Routine healthcare maintenance including cholesterol, diabetes screening discussed managed by PCP  5) Return in about 1 year (around 03/11/2021) for annual established gyn.   Rod Can, [facility] 03/11/2020, 2:04 PM

## 2020-03-11 NOTE — Patient Instructions (Signed)
Health Maintenance, Female Adopting a healthy lifestyle and getting preventive care are important in promoting health and wellness. Ask your health care provider about:  The right schedule for you to have regular tests and exams.  Things you can do on your own to prevent diseases and keep yourself healthy. What should I know about diet, weight, and exercise? Eat a healthy diet   Eat a diet that includes plenty of vegetables, fruits, low-fat dairy products, and lean protein.  Do not eat a lot of foods that are high in solid fats, added sugars, or sodium. Maintain a healthy weight Body mass index (BMI) is used to identify weight problems. It estimates body fat based on height and weight. Your health care provider can help determine your BMI and help you achieve or maintain a healthy weight. Get regular exercise Get regular exercise. This is one of the most important things you can do for your health. Most adults should:  Exercise for at least 150 minutes each week. The exercise should increase your heart rate and make you sweat (moderate-intensity exercise).  Do strengthening exercises at least twice a week. This is in addition to the moderate-intensity exercise.  Spend less time sitting. Even light physical activity can be beneficial. Watch cholesterol and blood lipids Have your blood tested for lipids and cholesterol at 31 years of age, then have this test every 5 years. Have your cholesterol levels checked more often if:  Your lipid or cholesterol levels are high.  You are older than 31 years of age.  You are at high risk for heart disease. What should I know about cancer screening? Depending on your health history and family history, you may need to have cancer screening at various ages. This may include screening for:  Breast cancer.  Cervical cancer.  Colorectal cancer.  Skin cancer.  Lung cancer. What should I know about heart disease, diabetes, and high blood  pressure? Blood pressure and heart disease  High blood pressure causes heart disease and increases the risk of stroke. This is more likely to develop in people who have high blood pressure readings, are of African descent, or are overweight.  Have your blood pressure checked: ? Every 3-5 years if you are 18-39 years of age. ? Every year if you are 40 years old or older. Diabetes Have regular diabetes screenings. This checks your fasting blood sugar level. Have the screening done:  Once every three years after age 40 if you are at a normal weight and have a low risk for diabetes.  More often and at a younger age if you are overweight or have a high risk for diabetes. What should I know about preventing infection? Hepatitis B If you have a higher risk for hepatitis B, you should be screened for this virus. Talk with your health care provider to find out if you are at risk for hepatitis B infection. Hepatitis C Testing is recommended for:  Everyone born from 1945 through 1965.  Anyone with known risk factors for hepatitis C. Sexually transmitted infections (STIs)  Get screened for STIs, including gonorrhea and chlamydia, if: ? You are sexually active and are younger than 31 years of age. ? You are older than 31 years of age and your health care provider tells you that you are at risk for this type of infection. ? Your sexual activity has changed since you were last screened, and you are at increased risk for chlamydia or gonorrhea. Ask your health care provider if   you are at risk.  Ask your health care provider about whether you are at high risk for HIV. Your health care provider may recommend a prescription medicine to help prevent HIV infection. If you choose to take medicine to prevent HIV, you should first get tested for HIV. You should then be tested every 3 months for as long as you are taking the medicine. Pregnancy  If you are about to stop having your period (premenopausal) and  you may become pregnant, seek counseling before you get pregnant.  Take 400 to 800 micrograms (mcg) of folic acid every day if you become pregnant.  Ask for birth control (contraception) if you want to prevent pregnancy. Osteoporosis and menopause Osteoporosis is a disease in which the bones lose minerals and strength with aging. This can result in bone fractures. If you are 65 years old or older, or if you are at risk for osteoporosis and fractures, ask your health care provider if you should:  Be screened for bone loss.  Take a calcium or vitamin D supplement to lower your risk of fractures.  Be given hormone replacement therapy (HRT) to treat symptoms of menopause. Follow these instructions at home: Lifestyle  Do not use any products that contain nicotine or tobacco, such as cigarettes, e-cigarettes, and chewing tobacco. If you need help quitting, ask your health care provider.  Do not use street drugs.  Do not share needles.  Ask your health care provider for help if you need support or information about quitting drugs. Alcohol use  Do not drink alcohol if: ? Your health care provider tells you not to drink. ? You are pregnant, may be pregnant, or are planning to become pregnant.  If you drink alcohol: ? Limit how much you use to 0-1 drink a day. ? Limit intake if you are breastfeeding.  Be aware of how much alcohol is in your drink. In the [place] one drink equals one 12 oz bottle of beer (355 mL), one 5 oz glass of wine (148 mL), or one 1 oz glass of hard liquor (44 mL). General instructions  Schedule regular health, dental, and eye exams.  Stay current with your vaccines.  Tell your health care provider if: ? You often feel depressed. ? You have ever been abused or do not feel safe at home. Summary  Adopting a healthy lifestyle and getting preventive care are important in promoting health and wellness.  Follow your health care provider's instructions about healthy  diet, exercising, and getting tested or screened for diseases.  Follow your health care provider's instructions on monitoring your cholesterol and blood pressure. This information is not intended to replace advice given to you by your health care provider. Make sure you discuss any questions you have with your health care provider. Document Revised: 11/22/2018 Document Reviewed: 11/22/2018 Elsevier Patient Education  2020 Elsevier Inc.  

## 2020-03-12 LAB — RPR QUALITATIVE: RPR Ser Ql: NONREACTIVE

## 2020-03-12 LAB — HEPATITIS B SURFACE ANTIGEN: Hepatitis B Surface Ag: NEGATIVE

## 2020-03-12 LAB — HIV ANTIBODY (ROUTINE TESTING W REFLEX): HIV Screen 4th Generation wRfx: NONREACTIVE

## 2020-03-13 LAB — CYTOLOGY - PAP
Chlamydia: POSITIVE — AB
Comment: NEGATIVE
Comment: NEGATIVE
Comment: NEGATIVE
Comment: NORMAL
Diagnosis: NEGATIVE
High risk HPV: NEGATIVE
Neisseria Gonorrhea: NEGATIVE
Trichomonas: POSITIVE — AB

## 2020-03-16 ENCOUNTER — Other Ambulatory Visit: Payer: Self-pay | Admitting: Advanced Practice Midwife

## 2020-03-16 DIAGNOSIS — A749 Chlamydial infection, unspecified: Secondary | ICD-10-CM

## 2020-03-16 DIAGNOSIS — A599 Trichomoniasis, unspecified: Secondary | ICD-10-CM

## 2020-03-16 MED ORDER — AZITHROMYCIN 500 MG PO TABS: 1000.0000 mg | ORAL_TABLET | Freq: Once | ORAL | 0 refills | Status: AC

## 2020-03-16 MED ORDER — METRONIDAZOLE 500 MG PO TABS: 2000.0000 mg | ORAL_TABLET | Freq: Once | ORAL | 0 refills | Status: AC

## 2020-03-16 NOTE — Progress Notes (Unsigned)
Rx's sent to treat chlamydia and trichomonal infections. Communicable disease report sent to [facility]. Spoke with patient. She is aware of results and prescriptions/notify partner, etc.

## 2020-03-22 IMAGING — US US ABDOMEN LIMITED
1 series · 14 of 25 positions shown · non-contrast
Comparison: Abdominal CT 05/31/2016

CLINICAL DATA: 27-year-old with right upper quadrant pain for 3
days.

EXAM:
ULTRASOUND ABDOMEN LIMITED RIGHT UPPER QUADRANT

[Series 1: us abdomen limited · 0.17mm/px · 14 of 35 slices shown]
[im 1/35]
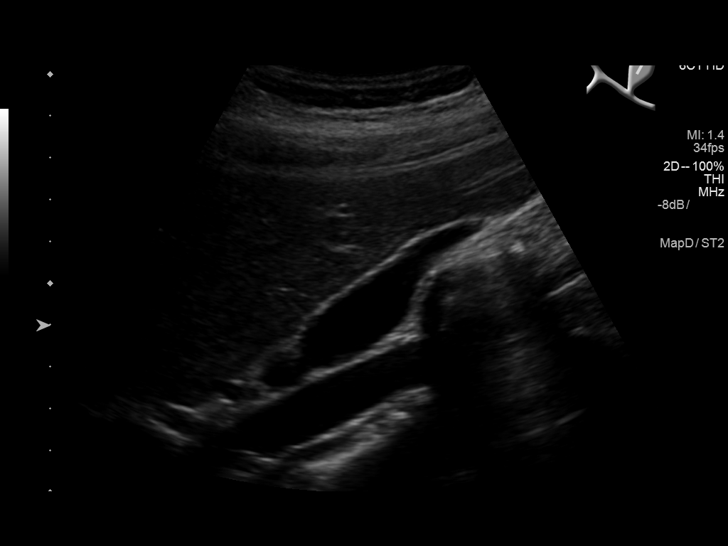
[im 3/35]
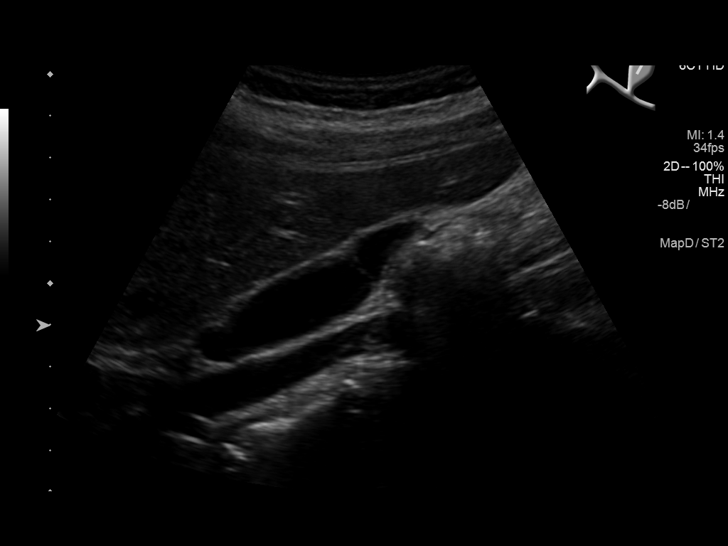
[im 6/35]
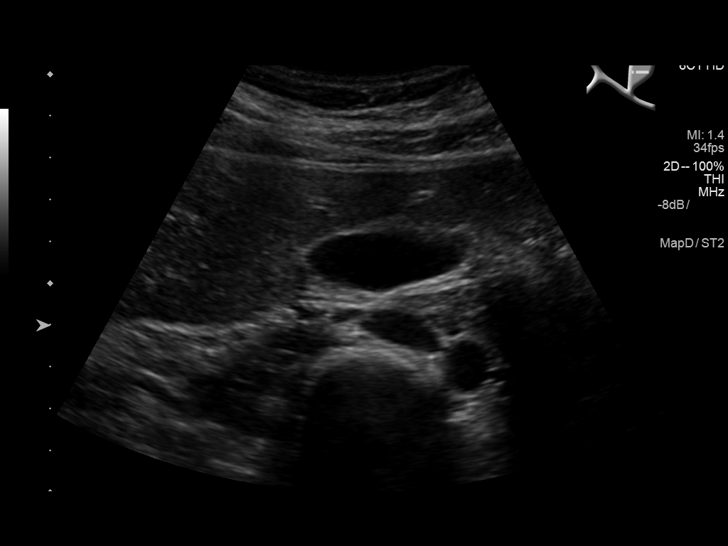
[im 9/35]
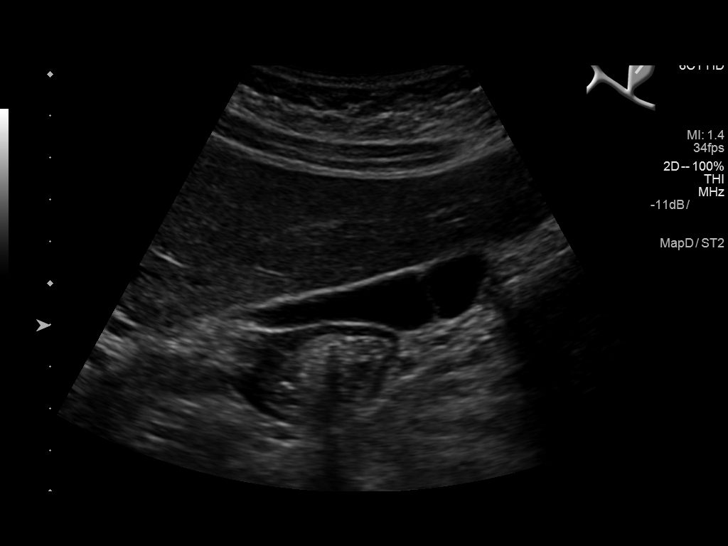
[im 12/35]
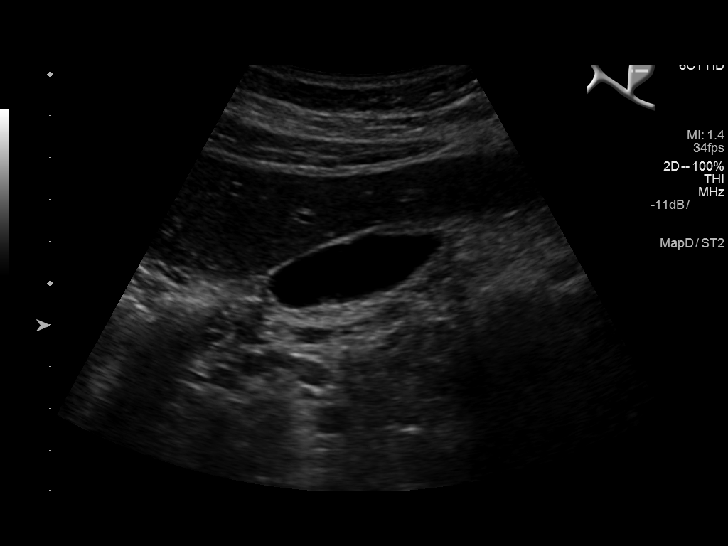
[im 13/35]
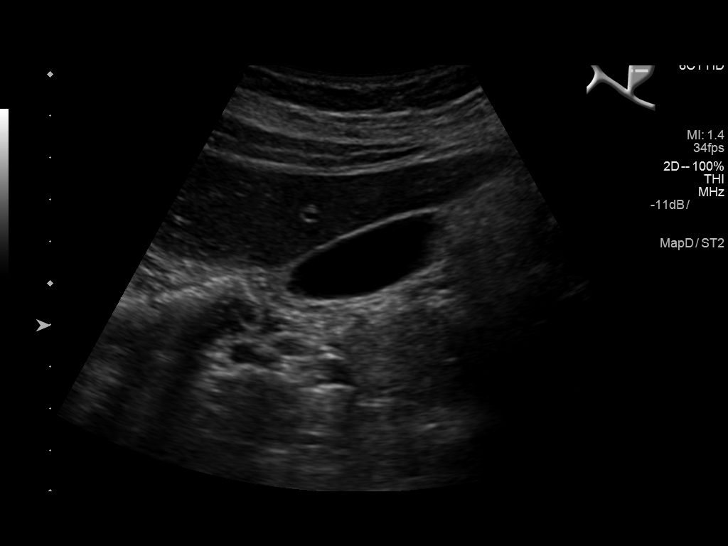
[im 16/35]
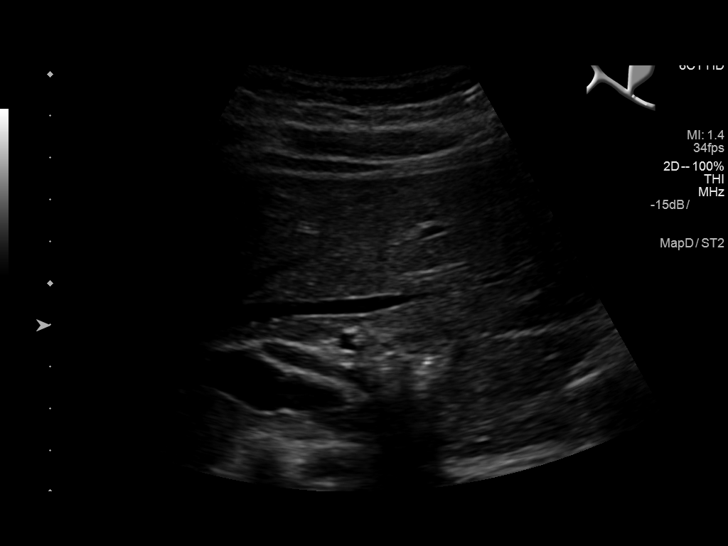
[im 19/35]
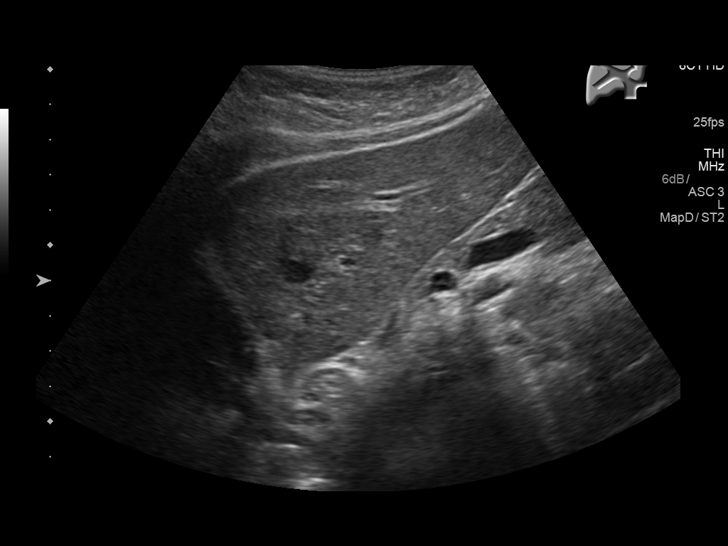
[im 22/35]
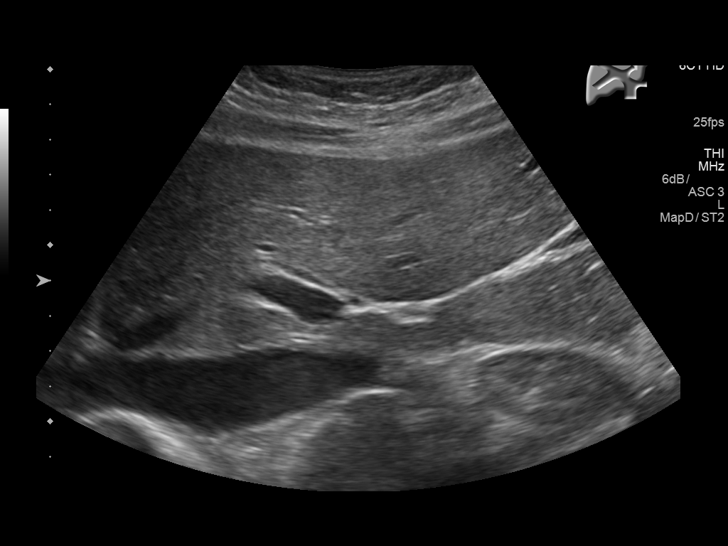
[im 23/35]
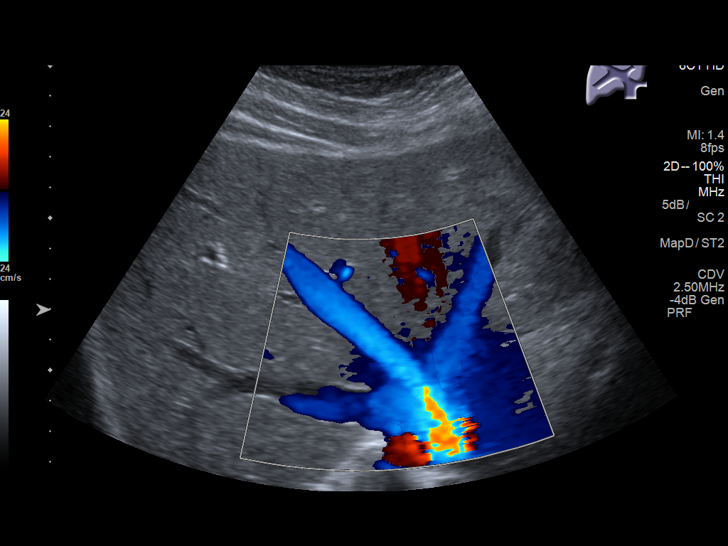
[im 26/35]
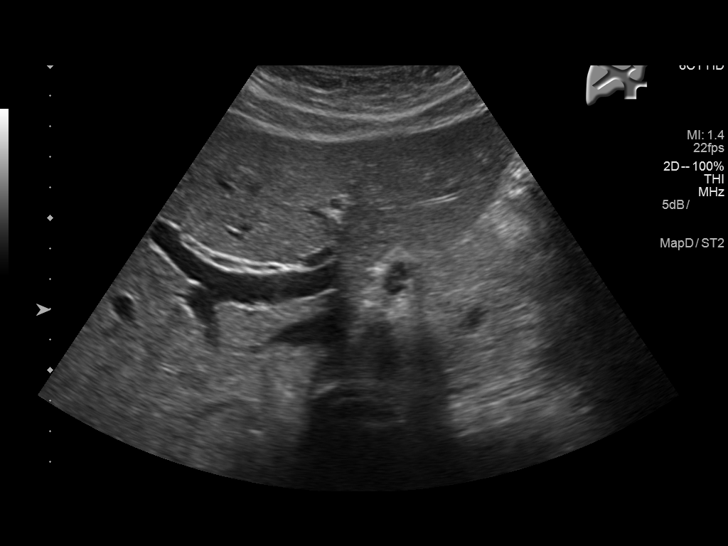
[im 29/35]
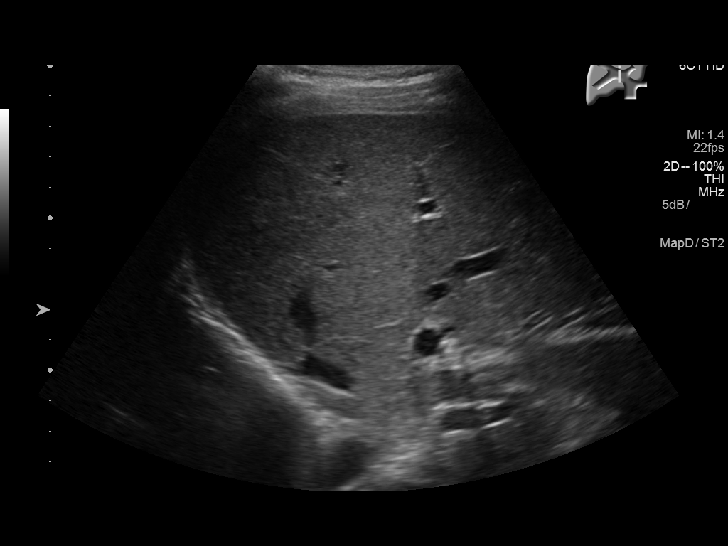
[im 32/35]
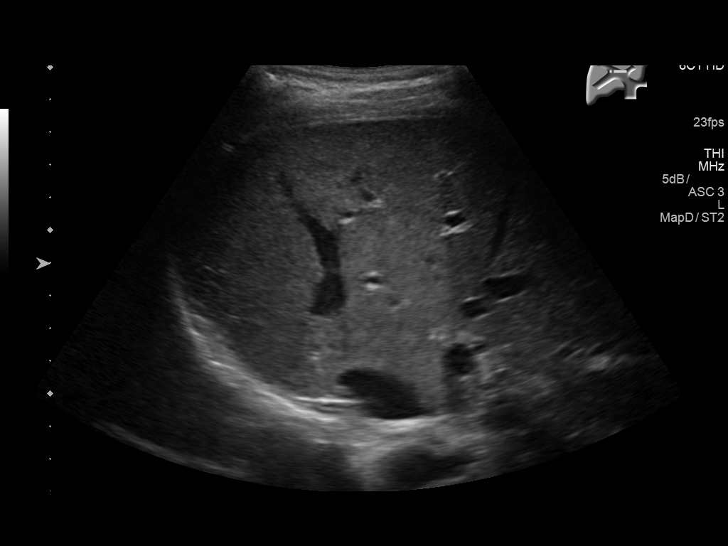
[im 35/35]
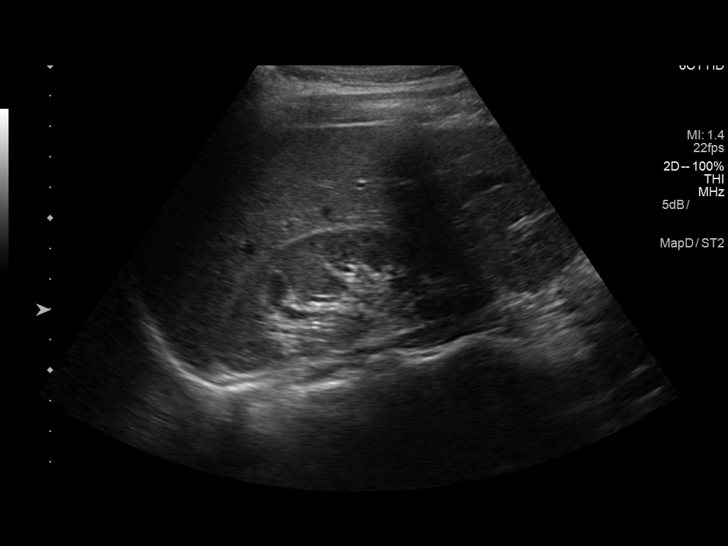

[14 of 25 positions shown; findings below may reference images not displayed]

FINDINGS: Gallbladder:

No gallstones or wall thickening visualized. No sonographic Murphy
sign noted by sonographer.

Common bile duct:

Diameter: 0.3 cm

Liver:

No focal lesion identified. Within normal limits in parenchymal
echogenicity. Portal vein is patent on color Doppler imaging with
normal direction of blood flow towards the liver.
IMPRESSION: Normal right upper quadrant ultrasound.
